# Patient Record
Sex: Male | Born: 1955 | Race: White | Hispanic: Yes | Marital: Married | Smoking: Never smoker
Health system: Southern US, Community
[De-identification: ages and names within clinical notes are randomized; demographics above are authoritative.]

## PROBLEM LIST (undated history)

## (undated) DIAGNOSIS — E669 Obesity, unspecified: Secondary | ICD-10-CM

## (undated) DIAGNOSIS — E785 Hyperlipidemia, unspecified: Secondary | ICD-10-CM

## (undated) DIAGNOSIS — K311 Adult hypertrophic pyloric stenosis: Secondary | ICD-10-CM

## (undated) DIAGNOSIS — M109 Gout, unspecified: Secondary | ICD-10-CM

## (undated) DIAGNOSIS — E119 Type 2 diabetes mellitus without complications: Secondary | ICD-10-CM

## (undated) DIAGNOSIS — I10 Essential (primary) hypertension: Secondary | ICD-10-CM

## (undated) DIAGNOSIS — W3400XA Accidental discharge from unspecified firearms or gun, initial encounter: Secondary | ICD-10-CM

## (undated) DIAGNOSIS — J45909 Unspecified asthma, uncomplicated: Secondary | ICD-10-CM

## (undated) DIAGNOSIS — F1910 Other psychoactive substance abuse, uncomplicated: Secondary | ICD-10-CM

## (undated) DIAGNOSIS — Z905 Acquired absence of kidney: Secondary | ICD-10-CM

## (undated) DIAGNOSIS — I639 Cerebral infarction, unspecified: Secondary | ICD-10-CM

## (undated) DIAGNOSIS — Z8719 Personal history of other diseases of the digestive system: Secondary | ICD-10-CM

## (undated) DIAGNOSIS — Z8709 Personal history of other diseases of the respiratory system: Secondary | ICD-10-CM

## (undated) DIAGNOSIS — G4730 Sleep apnea, unspecified: Secondary | ICD-10-CM

## (undated) DIAGNOSIS — K219 Gastro-esophageal reflux disease without esophagitis: Secondary | ICD-10-CM

## (undated) HISTORY — DX: Hyperlipidemia, unspecified: E78.5

## (undated) HISTORY — DX: Unspecified asthma, uncomplicated: J45.909

## (undated) HISTORY — DX: Acquired absence of kidney: Z90.5

## (undated) HISTORY — DX: Adult hypertrophic pyloric stenosis: K31.1

## (undated) HISTORY — PX: OTHER SURGICAL HISTORY: SHX169

## (undated) HISTORY — DX: Other psychoactive substance abuse, uncomplicated: F19.10

## (undated) HISTORY — PX: COLONOSCOPY: SHX174

## (undated) HISTORY — DX: Personal history of other diseases of the respiratory system: Z87.09

## (undated) HISTORY — DX: Essential (primary) hypertension: I10

## (undated) HISTORY — DX: Gout, unspecified: M10.9

## (undated) HISTORY — DX: Cerebral infarction, unspecified: I63.9

## (undated) HISTORY — DX: Type 2 diabetes mellitus without complications: E11.9

## (undated) HISTORY — DX: Personal history of other diseases of the digestive system: Z87.19

## (undated) HISTORY — DX: Obesity, unspecified: E66.9

## (undated) HISTORY — DX: Gastro-esophageal reflux disease without esophagitis: K21.9

---
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
## 1976-11-08 HISTORY — PX: OTHER SURGICAL HISTORY: SHX169

## 1977-09-25 DIAGNOSIS — W3400XA Accidental discharge from unspecified firearms or gun, initial encounter: Secondary | ICD-10-CM

## 1977-09-25 HISTORY — DX: Accidental discharge from unspecified firearms or gun, initial encounter: W34.00XA

## 1981-11-08 HISTORY — PX: OTHER SURGICAL HISTORY: SHX169

## 2014-01-21 ENCOUNTER — Encounter: Payer: Self-pay | Admitting: Family Medicine

## 2014-01-21 ENCOUNTER — Ambulatory Visit (INDEPENDENT_AMBULATORY_CARE_PROVIDER_SITE_OTHER): Payer: 59 | Admitting: Family Medicine

## 2014-01-21 VITALS — BP 116/67 | HR 75 | Ht 73.0 in | Wt 241.0 lb

## 2014-01-21 DIAGNOSIS — R200 Anesthesia of skin: Secondary | ICD-10-CM | POA: Insufficient documentation

## 2014-01-21 DIAGNOSIS — Z79899 Other long term (current) drug therapy: Secondary | ICD-10-CM

## 2014-01-21 DIAGNOSIS — E785 Hyperlipidemia, unspecified: Secondary | ICD-10-CM | POA: Insufficient documentation

## 2014-01-21 DIAGNOSIS — R209 Unspecified disturbances of skin sensation: Secondary | ICD-10-CM

## 2014-01-21 DIAGNOSIS — Z5181 Encounter for therapeutic drug level monitoring: Secondary | ICD-10-CM

## 2014-01-21 DIAGNOSIS — Z905 Acquired absence of kidney: Secondary | ICD-10-CM

## 2014-01-21 DIAGNOSIS — M109 Gout, unspecified: Secondary | ICD-10-CM | POA: Insufficient documentation

## 2014-01-21 DIAGNOSIS — E119 Type 2 diabetes mellitus without complications: Secondary | ICD-10-CM | POA: Insufficient documentation

## 2014-01-21 DIAGNOSIS — R208 Other disturbances of skin sensation: Secondary | ICD-10-CM | POA: Insufficient documentation

## 2014-01-21 MED ORDER — METFORMIN HCL 1000 MG PO TABS: ORAL_TABLET | ORAL | Status: DC

## 2014-01-21 MED ORDER — INSULIN NPH (HUMAN) (ISOPHANE) 100 UNIT/ML ~~LOC~~ SUSP: SUBCUTANEOUS | Status: DC

## 2014-01-21 MED ORDER — PRAVASTATIN SODIUM 40 MG PO TABS: 40.0000 mg | ORAL_TABLET | Freq: Every day | ORAL | Status: DC

## 2014-01-21 MED ORDER — LISINOPRIL 5 MG PO TABS: 5.0000 mg | ORAL_TABLET | Freq: Every day | ORAL | Status: DC
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
## 2014-01-24 ENCOUNTER — Encounter: Payer: Self-pay | Admitting: Family Medicine

## 2014-01-24 ENCOUNTER — Ambulatory Visit (INDEPENDENT_AMBULATORY_CARE_PROVIDER_SITE_OTHER): Payer: 59 | Admitting: Family Medicine

## 2014-01-24 VITALS — BP 137/77 | HR 94 | Ht 73.0 in | Wt 239.0 lb

## 2014-01-24 DIAGNOSIS — Z8249 Family history of ischemic heart disease and other diseases of the circulatory system: Secondary | ICD-10-CM

## 2014-01-24 DIAGNOSIS — E119 Type 2 diabetes mellitus without complications: Secondary | ICD-10-CM

## 2014-01-24 DIAGNOSIS — Z2839 Other underimmunization status: Secondary | ICD-10-CM

## 2014-01-24 DIAGNOSIS — Z23 Encounter for immunization: Secondary | ICD-10-CM

## 2014-01-24 DIAGNOSIS — Z125 Encounter for screening for malignant neoplasm of prostate: Secondary | ICD-10-CM

## 2014-01-24 DIAGNOSIS — Z0000 Encounter for general adult medical examination without abnormal findings: Secondary | ICD-10-CM

## 2014-01-24 DIAGNOSIS — Z283 Underimmunization status: Secondary | ICD-10-CM
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
## 2014-01-24 NOTE — Patient Instructions (Signed)
Dr. Lajoyce Lauber General Advice Following Your Complete Physical Exam  The Benefits of Regular Exercise: Unless you suffer from an uncontrolled cardiovascular condition, studies strongly suggest that regular exercise and physical activity will add to both the quality and length of your life.  The World Health Organization recommends 150 minutes of moderate intensity aerobic activity every week.  This is best split over 3-4 days a week, and can be as simple as a brisk walk for just over 35 minutes "most days of the week".  This type of exercise has been shown to lower LDL-Cholesterol, lower average blood sugars, lower blood pressure, lower cardiovascular disease risk, improve memory, and increase one's overall sense of wellbeing.  The addition of anaerobic (or "strength training") exercises offers additional benefits including but not limited to increased metabolism, prevention of osteoporosis, and improved overall cholesterol levels.  How Can I Strive For A Low-Fat Diet?: Current guidelines recommend that 25-35 percent of your daily energy (food) intake should come from fats.  One might ask how can this be achieved without having to dissect each meal on a daily basis?  Switch to skim or 1% milk instead of whole milk.  Focus on lean meats such as ground [place], fresh fish, baked chicken, and lean cuts of beef as your source of dietary protein.  Limit saturated fat consumption to less than 10% of your daily caloric intake.  Limit trans fatty acid consumption primarily by limiting synthetic trans fats such as partially hydrogenated oils (Ex: fried fast foods).  Substitute olive or vegetable oil for solid fats where possible.  Moderation of Salt Intake: Provided you don't carry a diagnosis of congestive heart failure nor renal failure, I recommend a daily allowance of no more than 2300 mg of salt (sodium).  Keeping under this daily goal is associated with a decreased risk of cardiovascular events, creeping  above it can lead to elevated blood pressures and increases your risk of cardiovascular events.  Milligrams (mg) of salt is listed on all nutrition labels, and your daily intake can add up faster than you think.  Most canned and frozen dinners can pack in over half your daily salt allowance in one meal.    Lifestyle Health Risks: Certain lifestyle choices carry specific health risks.  As you may already know, tobacco use has been associated with increasing one's risk of cardiovascular disease, pulmonary disease, numerous cancers, among many other issues.  What you may not know is that there are medications and nicotine replacement strategies that can more than double your chances of successfully quitting.  I would be thrilled to help manage your quitting strategy if you currently use tobacco products.  When it comes to alcohol use, I've yet to find an "ideal" daily allowance.  Provided an individual does not have a medical condition that is exacerbated by alcohol consumption, general guidelines determine "safe drinking" as no more than two standard drinks for a man or no more than one standard drink for a male per day.  However, much debate still exists on whether any amount of alcohol consumption is technically "safe".  My general advice, keep alcohol consumption to a minimum for general health promotion.  If you or others believe that alcohol, tobacco, or recreational drug use is interfering with your life, I would be happy to provide confidential counseling regarding treatment options.  General "Over The Counter" Nutrition Advice: Postmenopausal women should aim for a daily calcium intake of 1200 mg, however a significant portion of this might already be  provided by diets including milk, yogurt, cheese, and other dairy products.  Vitamin D has been shown to help preserve bone density, prevent fatigue, and has even been shown to help reduce falls in the elderly.  Ensuring a daily intake of 800 Units of  Vitamin D is a good place to start to enjoy the above benefits, we can easily check your Vitamin D level to see if you'd potentially benefit from supplementation beyond 800 Units a day.  Folic Acid intake should be of particular concern to women of childbearing age.  Daily consumption of 400-800 mcg of Folic Acid is recommended to minimize the chance of spinal cord defects in a fetus should pregnancy occur.    For many adults, accidents still remain one of the most common culprits when it comes to cause of death.  Some of the simplest but most effective preventitive habits you can adopt include regular seatbelt use, proper helmet use, securing firearms, and regularly testing your smoke and carbon monoxide detectors.  Balraj Brayfield B. Aquita Simmering DO [facility] [address], [address] , [place] [address] Phone: [phone]  Testicular Self-Exam A self-examination of your testicles involves looking at and feeling your testicles for abnormal lumps or swelling. Several things can cause swelling, lumps, or pain in your testicles. Some of these causes are:  Injuries.  Inflammation.  Infection.  Accumulation of fluids around your testicle (hydrocele).  Twisted testicles (testicular torsion).  Testicular cancer. Self-examination of the testicles and groin areas may be advised if you are at risk for testicular cancer. Risks for testicular cancer include:  An undescended testicle (cryptorchidism).  A history of previous testicular cancer.  A family history of testicular cancer. The testicles are easiest to examine after warm baths or showers and are more difficult to examine when you are cold. This is because the muscles attached to the testicles retract and pull them up higher or into the abdomen. Follow these steps while you are standing:  Hold your penis away from your body.  Roll one testicle between your thumb and forefinger, feeling the entire testicle.  Roll the other  testicle between your thumb and forefinger, feeling the entire testicle. Feel for lumps, swelling, or discomfort. A normal testicle is egg shaped and feels firm. It is smooth and not tender. The spermatic cord can be felt as a firm spaghetti-like cord at the back of your testicle. It is also important to examine the crease between the front of your leg and your abdomen. Feel for any bumps that are tender. These could be enlarged lymph nodes.  Document Released: 01/31/2001 Document Revised: 06/27/2013 Document Reviewed: 04/16/2013 [employer] Patient Information 2014 [employer].  

## 2014-01-25 ENCOUNTER — Telehealth: Payer: Self-pay | Admitting: Family Medicine

## 2014-01-29 ENCOUNTER — Telehealth: Payer: Self-pay | Admitting: Family Medicine

## 2014-01-29 DIAGNOSIS — E119 Type 2 diabetes mellitus without complications: Secondary | ICD-10-CM

## 2014-01-29 LAB — COMPLETE METABOLIC PANEL WITH GFR
ALT: 20 U/L (ref 0–53)
AST: 15 U/L (ref 0–37)
Albumin: 4.5 g/dL (ref 3.5–5.2)
Alkaline Phosphatase: 80 U/L (ref 39–117)
BUN: 16 mg/dL (ref 6–23)
CALCIUM: 9.9 mg/dL (ref 8.4–10.5)
CHLORIDE: 100 meq/L (ref 96–112)
CO2: 28 meq/L (ref 19–32)
CREATININE: 1.02 mg/dL (ref 0.50–1.35)
GFR, Est Non African American: 80 mL/min
Glucose, Bld: 175 mg/dL — ABNORMAL HIGH (ref 70–99)
POTASSIUM: 4.3 meq/L (ref 3.5–5.3)
Sodium: 141 mEq/L (ref 135–145)
Total Bilirubin: 1.2 mg/dL (ref 0.2–1.2)
Total Protein: 6.8 g/dL (ref 6.0–8.3)

## 2014-01-29 LAB — LIPID PANEL
Cholesterol: 178 mg/dL (ref 0–200)
HDL: 48 mg/dL (ref >39)
LDL CALC: 108 mg/dL — AB (ref 0–99)
Total CHOL/HDL Ratio: 3.7 Ratio
Triglycerides: 108 mg/dL (ref <150)
VLDL: 22 mg/dL (ref 0–40)

## 2014-01-29 LAB — HEMOGLOBIN A1C
HEMOGLOBIN A1C: 9.8 % — AB (ref <5.7)
Mean Plasma Glucose: 235 mg/dL — ABNORMAL HIGH (ref <117)

## 2014-01-29 LAB — PSA: PSA: 0.62 ng/mL (ref <=4.00)

## 2014-01-29 MED ORDER — DAPAGLIFLOZIN PROPANEDIOL 10 MG PO TABS: 10.0000 mg | ORAL_TABLET | ORAL | Status: DC

## 2014-01-29 NOTE — Telephone Encounter (Signed)
Pt notified; coupon card up front

## 2014-01-29 NOTE — Telephone Encounter (Signed)
William Bean, Will you please let William Bean know that his PSA was normal, kidney and liver function normal, and cholesterol was just barely elevated.  A1c was 9.8 with a goal of less than 7.0.  I'd encourage him to continue on meformin and insulin however I'd like him to take advantage of a new free medication called Wilder Glade that I've sent to his pharmacy.  It helps lower blood sugar through the urine, I'd encourage him to swing by sometime to pickup a coupon card in the sample closet which makes this medication free.  Follow up 3 months. No changes other than new diabetic medication.

## 2014-01-30 LAB — MICROALBUMIN / CREATININE URINE RATIO
Creatinine, Urine: 101.8 mg/dL
Microalb Creat Ratio: 10 mg/g (ref 0.0–30.0)
Microalb, Ur: 1.02 mg/dL (ref 0.00–1.89)

## 2014-02-15 ENCOUNTER — Telehealth: Payer: Self-pay | Admitting: *Deleted

## 2014-02-15 DIAGNOSIS — J45909 Unspecified asthma, uncomplicated: Secondary | ICD-10-CM

## 2014-02-15 MED ORDER — ALBUTEROL SULFATE HFA 108 (90 BASE) MCG/ACT IN AERS: INHALATION_SPRAY | RESPIRATORY_TRACT | Status: DC

## 2014-02-15 NOTE — Telephone Encounter (Signed)
No problem, Rx sent to [facility] in [place].

## 2014-02-15 NOTE — Telephone Encounter (Signed)
Pt notified via vm

## 2014-02-15 NOTE — Telephone Encounter (Signed)
Pt left a message that he needs an albuterol inhaler sent to his pharm. He states he briefly mentioned this at his last visit that he used this as a child. This is not on his med list and I dont see that he discussed this with you. Please advise

## 2014-02-19 ENCOUNTER — Encounter: Payer: Self-pay | Admitting: Family Medicine

## 2014-02-19 ENCOUNTER — Ambulatory Visit (INDEPENDENT_AMBULATORY_CARE_PROVIDER_SITE_OTHER): Payer: 59 | Admitting: Family Medicine

## 2014-02-19 VITALS — BP 119/76 | HR 90 | Temp 98.5°F | Wt 239.0 lb

## 2014-02-19 DIAGNOSIS — E119 Type 2 diabetes mellitus without complications: Secondary | ICD-10-CM

## 2014-02-19 DIAGNOSIS — J069 Acute upper respiratory infection, unspecified: Secondary | ICD-10-CM
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
## 2014-02-26 ENCOUNTER — Encounter: Payer: Self-pay | Admitting: *Deleted

## 2014-02-26 ENCOUNTER — Encounter: Payer: Self-pay | Admitting: Cardiology

## 2014-02-27 ENCOUNTER — Ambulatory Visit (INDEPENDENT_AMBULATORY_CARE_PROVIDER_SITE_OTHER): Payer: 59 | Admitting: Cardiology

## 2014-02-27 ENCOUNTER — Encounter: Payer: Self-pay | Admitting: Cardiology

## 2014-02-27 VITALS — BP 142/74 | HR 84 | Ht 73.0 in | Wt 235.0 lb

## 2014-02-27 DIAGNOSIS — R0989 Other specified symptoms and signs involving the circulatory and respiratory systems: Secondary | ICD-10-CM

## 2014-02-27 DIAGNOSIS — E785 Hyperlipidemia, unspecified: Secondary | ICD-10-CM

## 2014-02-27 DIAGNOSIS — R0600 Dyspnea, unspecified: Secondary | ICD-10-CM | POA: Insufficient documentation

## 2014-02-27 DIAGNOSIS — I2510 Atherosclerotic heart disease of native coronary artery without angina pectoris: Secondary | ICD-10-CM

## 2014-02-27 DIAGNOSIS — R0609 Other forms of dyspnea: Secondary | ICD-10-CM

## 2014-02-27 NOTE — Assessment & Plan Note (Signed)
Continue statin. 

## 2014-02-27 NOTE — Patient Instructions (Signed)
Your physician recommends that you schedule a follow-up appointment in: AS NEEDED PENDING TEST RESULTS  Your physician has requested that you have an exercise tolerance test. For further information please visit [email or web address]. Please also follow instruction sheet, as given.    Exercise Stress Electrocardiogram An exercise stress electrocardiogram is a test to check how blood flows to your heart. It is done to find areas of poor blood flow. You will need to walk on a treadmill for this test. The electrocardiogram will record your heartbeat when you are at rest and when you are exercising. BEFORE THE PROCEDURE  Do not have drinks with caffeine or foods with caffeine for 24 hours before the test, or as told by your doctor.  Follow your doctor's instructions about eating and drinking before the test.  Ask your doctor what medicines you should or should not take before the test. Take your medicines with water unless told by your doctor not to.  If you use an inhaler, bring it with you to the test.  Bring a snack to eat after the test.  Do not  smoke for 4 hours before the test.  Wear comfortable shoes and clothing. PROCEDURE  You will have patches put on your chest. Small areas of your chest may need to be shaved. Wires will be connected to the patches.  Your heart rate will be watched while you are resting and while you are exercising.  You will walk on the treadmill. The treadmill will slowly get faster to raise your heart rate.  The test will take about 1 2 hours. AFTER THE PROCEDURE  Your heart rate and blood pressure will be watched after the test.  You may return to your normal diet, activities, and medicines or as told by your doctor. Document Released: 04/12/2008 Document Revised: 08/15/2013 Document Reviewed: 07/02/2013 Exit[employer] Patient Information 2014 [employer].   
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
## 2014-02-27 NOTE — Assessment & Plan Note (Signed)
Mild dyspnea with extreme activities; 7 year history of DM; ETT for risk stratification.

## 2014-03-05 ENCOUNTER — Ambulatory Visit: Payer: 59 | Admitting: Endocrinology

## 2014-03-11 ENCOUNTER — Ambulatory Visit (INDEPENDENT_AMBULATORY_CARE_PROVIDER_SITE_OTHER): Payer: 59 | Admitting: Endocrinology

## 2014-03-11 ENCOUNTER — Encounter: Payer: Self-pay | Admitting: Endocrinology

## 2014-03-11 VITALS — BP 112/60 | HR 90 | Temp 97.7°F | Ht 73.0 in | Wt 239.0 lb

## 2014-03-11 DIAGNOSIS — E119 Type 2 diabetes mellitus without complications: Secondary | ICD-10-CM

## 2014-03-11 MED ORDER — GLUCOSE BLOOD VI STRP: 1.0000 | ORAL_STRIP | Freq: Two times a day (BID) | Status: DC
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
## 2014-03-11 NOTE — Patient Instructions (Addendum)
good diet and exercise habits significanly improve the control of your diabetes.  please let me know if you wish to be referred to a dietician.  high blood sugar is very risky to your health.  you should see an eye doctor every year.  You are at higher than average risk for pneumonia and hepatitis-B.  You should be vaccinated against both.   controlling your blood pressure and cholesterol drastically reduces the damage diabetes does to your body.  this also applies to quitting smoking.  please discuss these with your doctor.  check your blood sugar twice a day.  vary the time of day when you check, between before the 3 meals, and at bedtime.  also check if you have symptoms of your blood sugar being too high or too low.  please keep a record of the readings and bring it to your next appointment here.  You can write it on any piece of paper.  please call us sooner if your blood sugar goes below 70, or if you have a lot of readings over 200.   Here is a new meter.  i have sent a prescription to your pharmacy. Please come back for a follow-up appointment in 3 months.

## 2014-03-21 ENCOUNTER — Telehealth (HOSPITAL_COMMUNITY): Payer: Self-pay

## 2014-03-21 ENCOUNTER — Other Ambulatory Visit: Payer: Self-pay | Admitting: *Deleted

## 2014-03-21 DIAGNOSIS — J45909 Unspecified asthma, uncomplicated: Secondary | ICD-10-CM

## 2014-03-21 MED ORDER — ALBUTEROL SULFATE HFA 108 (90 BASE) MCG/ACT IN AERS: INHALATION_SPRAY | RESPIRATORY_TRACT | Status: DC

## 2014-03-22 ENCOUNTER — Telehealth (HOSPITAL_COMMUNITY): Payer: Self-pay

## 2014-03-27 ENCOUNTER — Other Ambulatory Visit (HOSPITAL_COMMUNITY): Payer: Self-pay | Admitting: Cardiology

## 2014-03-27 ENCOUNTER — Ambulatory Visit (HOSPITAL_COMMUNITY)
Admission: RE | Admit: 2014-03-27 | Discharge: 2014-03-27 | Disposition: A | Discharge status: Home / Self Care | Payer: 59 | Source: Ambulatory Visit | Attending: Cardiology | Admitting: Cardiology

## 2014-03-27 DIAGNOSIS — R0609 Other forms of dyspnea: Secondary | ICD-10-CM | POA: Insufficient documentation

## 2014-03-27 DIAGNOSIS — R0600 Dyspnea, unspecified: Secondary | ICD-10-CM

## 2014-03-27 DIAGNOSIS — R0989 Other specified symptoms and signs involving the circulatory and respiratory systems: Secondary | ICD-10-CM | POA: Insufficient documentation

## 2014-03-27 DIAGNOSIS — I2510 Atherosclerotic heart disease of native coronary artery without angina pectoris: Secondary | ICD-10-CM | POA: Insufficient documentation

## 2014-03-29 ENCOUNTER — Telehealth: Payer: Self-pay | Admitting: *Deleted

## 2014-03-29 DIAGNOSIS — R9430 Abnormal result of cardiovascular function study, unspecified: Secondary | ICD-10-CM

## 2014-03-29 NOTE — Telephone Encounter (Signed)
Message     Schedule stress echocardiogram    Lelon Perla

## 2014-04-04 NOTE — Telephone Encounter (Signed)
Left message for pt to call.

## 2014-04-11 ENCOUNTER — Encounter: Payer: Self-pay | Admitting: *Deleted

## 2014-04-11 NOTE — Telephone Encounter (Signed)
Spoke with William Bean, Aware of dr Jacalyn Lefevre recommendations.  Instructions discussed with the William Bean. Orders placed and sent to [facility] for scheduling.

## 2014-05-03 ENCOUNTER — Other Ambulatory Visit (HOSPITAL_COMMUNITY): Payer: 59

## 2014-05-09 NOTE — Telephone Encounter (Signed)
Encounter complete. 

## 2014-05-16 NOTE — Telephone Encounter (Signed)
Encounter complete. 

## 2014-05-28 ENCOUNTER — Ambulatory Visit (HOSPITAL_COMMUNITY): Payer: 59 | Attending: Cardiology

## 2014-05-28 DIAGNOSIS — R9439 Abnormal result of other cardiovascular function study: Secondary | ICD-10-CM | POA: Insufficient documentation

## 2014-05-28 DIAGNOSIS — I2510 Atherosclerotic heart disease of native coronary artery without angina pectoris: Secondary | ICD-10-CM

## 2014-05-28 DIAGNOSIS — R9430 Abnormal result of cardiovascular function study, unspecified: Secondary | ICD-10-CM

## 2014-05-28 NOTE — Progress Notes (Signed)
Stress echo performed.

## 2014-06-17 ENCOUNTER — Ambulatory Visit: Payer: 59 | Admitting: Endocrinology

## 2014-06-17 DIAGNOSIS — Z0289 Encounter for other administrative examinations: Secondary | ICD-10-CM

## 2014-07-08 ENCOUNTER — Encounter: Payer: Self-pay | Admitting: Family Medicine

## 2014-07-08 ENCOUNTER — Ambulatory Visit (INDEPENDENT_AMBULATORY_CARE_PROVIDER_SITE_OTHER): Payer: 59 | Admitting: Family Medicine

## 2014-07-08 VITALS — BP 132/74 | HR 72 | Wt 235.0 lb

## 2014-07-08 DIAGNOSIS — N189 Chronic kidney disease, unspecified: Secondary | ICD-10-CM

## 2014-07-08 DIAGNOSIS — E1129 Type 2 diabetes mellitus with other diabetic kidney complication: Secondary | ICD-10-CM

## 2014-07-08 DIAGNOSIS — IMO0002 Reserved for concepts with insufficient information to code with codable children: Secondary | ICD-10-CM

## 2014-07-08 DIAGNOSIS — E1122 Type 2 diabetes mellitus with diabetic chronic kidney disease: Secondary | ICD-10-CM

## 2014-07-08 DIAGNOSIS — E785 Hyperlipidemia, unspecified: Secondary | ICD-10-CM

## 2014-07-08 DIAGNOSIS — M792 Neuralgia and neuritis, unspecified: Secondary | ICD-10-CM | POA: Insufficient documentation

## 2014-07-08 LAB — POCT GLYCOSYLATED HEMOGLOBIN (HGB A1C): Hemoglobin A1C: 8.4

## 2014-07-08 MED ORDER — LISINOPRIL 5 MG PO TABS: 5.0000 mg | ORAL_TABLET | Freq: Every day | ORAL | Status: DC

## 2014-07-08 MED ORDER — INSULIN NPH (HUMAN) (ISOPHANE) 100 UNIT/ML ~~LOC~~ SUSP: SUBCUTANEOUS | Status: DC
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
## 2014-07-09 LAB — BASIC METABOLIC PANEL WITH GFR
BUN: 14 mg/dL (ref 6–23)
CHLORIDE: 101 meq/L (ref 96–112)
CO2: 27 mEq/L (ref 19–32)
Calcium: 10 mg/dL (ref 8.4–10.5)
Creat: 1.12 mg/dL (ref 0.50–1.35)
GFR, EST NON AFRICAN AMERICAN: 71 mL/min
GFR, Est African American: 82 mL/min
Glucose, Bld: 195 mg/dL — ABNORMAL HIGH (ref 70–99)
Potassium: 5.1 mEq/L (ref 3.5–5.3)
Sodium: 141 mEq/L (ref 135–145)

## 2014-07-10 LAB — HM DIABETES EYE EXAM

## 2014-08-05 ENCOUNTER — Ambulatory Visit: Payer: 59 | Admitting: Sports Medicine

## 2014-08-12 ENCOUNTER — Encounter: Payer: Self-pay | Admitting: Family Medicine

## 2014-08-12 ENCOUNTER — Ambulatory Visit (INDEPENDENT_AMBULATORY_CARE_PROVIDER_SITE_OTHER): Payer: 59 | Admitting: Family Medicine

## 2014-08-12 VITALS — BP 128/74 | HR 80 | Temp 97.9°F | Ht 73.0 in | Wt 243.0 lb

## 2014-08-12 DIAGNOSIS — N2889 Other specified disorders of kidney and ureter: Secondary | ICD-10-CM | POA: Insufficient documentation

## 2014-08-12 DIAGNOSIS — Z23 Encounter for immunization: Secondary | ICD-10-CM

## 2014-08-12 DIAGNOSIS — E119 Type 2 diabetes mellitus without complications: Secondary | ICD-10-CM

## 2014-08-12 DIAGNOSIS — N181 Chronic kidney disease, stage 1: Secondary | ICD-10-CM | POA: Insufficient documentation

## 2014-08-12 DIAGNOSIS — N189 Chronic kidney disease, unspecified: Secondary | ICD-10-CM

## 2014-08-12 MED ORDER — PRAVASTATIN SODIUM 20 MG PO TABS: 20.0000 mg | ORAL_TABLET | Freq: Every day | ORAL | Status: DC

## 2014-08-12 NOTE — Addendum Note (Signed)
Addended by: Margette Fast on: 08/12/2014 11:26 AM   Modules accepted: Orders
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
## 2014-08-13 LAB — BASIC METABOLIC PANEL WITH GFR
BUN: 12 mg/dL (ref 6–23)
CO2: 27 meq/L (ref 19–32)
CREATININE: 0.91 mg/dL (ref 0.50–1.35)
Calcium: 9.5 mg/dL (ref 8.4–10.5)
Chloride: 103 mEq/L (ref 96–112)
GFR, Est African American: >89 mL/min
GFR, Est Non African American: >89 mL/min
GLUCOSE: 95 mg/dL (ref 70–99)
Potassium: 4.7 mEq/L (ref 3.5–5.3)
Sodium: 139 mEq/L (ref 135–145)

## 2014-08-23 ENCOUNTER — Ambulatory Visit: Payer: 59 | Admitting: Family Medicine

## 2014-08-26 ENCOUNTER — Ambulatory Visit (INDEPENDENT_AMBULATORY_CARE_PROVIDER_SITE_OTHER): Payer: 59

## 2014-08-26 ENCOUNTER — Encounter: Payer: Self-pay | Admitting: Family Medicine

## 2014-08-26 ENCOUNTER — Ambulatory Visit (INDEPENDENT_AMBULATORY_CARE_PROVIDER_SITE_OTHER): Payer: 59 | Admitting: Family Medicine

## 2014-08-26 VITALS — BP 134/72 | HR 81 | Temp 98.3°F | Wt 241.0 lb

## 2014-08-26 DIAGNOSIS — M2580 Other specified joint disorders, unspecified joint: Secondary | ICD-10-CM

## 2014-08-26 DIAGNOSIS — M549 Dorsalgia, unspecified: Secondary | ICD-10-CM

## 2014-08-26 DIAGNOSIS — N189 Chronic kidney disease, unspecified: Secondary | ICD-10-CM

## 2014-08-26 DIAGNOSIS — Z905 Acquired absence of kidney: Secondary | ICD-10-CM

## 2014-08-26 DIAGNOSIS — R109 Unspecified abdominal pain: Secondary | ICD-10-CM

## 2014-08-26 DIAGNOSIS — N181 Chronic kidney disease, stage 1: Secondary | ICD-10-CM

## 2014-08-26 LAB — POCT URINALYSIS DIPSTICK
BILIRUBIN UA: NEGATIVE
Blood, UA: NEGATIVE
COLOR UA: 100
Ketones, UA: NEGATIVE
LEUKOCYTES UA: NEGATIVE
Nitrite, UA: NEGATIVE
Protein, UA: NEGATIVE
Spec Grav, UA: 1.015
Urobilinogen, UA: 0.2
pH, UA: 7
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
## 2014-08-27 ENCOUNTER — Telehealth: Payer: Self-pay | Admitting: Family Medicine

## 2014-08-27 DIAGNOSIS — R109 Unspecified abdominal pain: Secondary | ICD-10-CM

## 2014-08-27 MED ORDER — DICYCLOMINE HCL 20 MG PO TABS: 20.0000 mg | ORAL_TABLET | Freq: Four times a day (QID) | ORAL | Status: DC | PRN

## 2014-08-27 NOTE — Telephone Encounter (Signed)
William Bean, Will you please let patient know that his xray did not show any signs of stones or calcification within the urinary system which is reassuring.  If his flank pain returns I'd recommend taking dicyclomine which treats bowel spasms.  I've sent this Rx to his [facility] to be used only on an as needed basis.

## 2014-08-27 NOTE — Telephone Encounter (Signed)
Pt.notified

## 2014-10-07 ENCOUNTER — Ambulatory Visit: Payer: 59 | Admitting: Family Medicine

## 2014-10-14 ENCOUNTER — Ambulatory Visit: Payer: 59 | Admitting: Family Medicine

## 2014-10-14 DIAGNOSIS — Z0289 Encounter for other administrative examinations: Secondary | ICD-10-CM

## 2014-12-11 ENCOUNTER — Encounter: Payer: Self-pay | Admitting: Family Medicine

## 2014-12-11 ENCOUNTER — Ambulatory Visit (INDEPENDENT_AMBULATORY_CARE_PROVIDER_SITE_OTHER): Payer: Self-pay | Admitting: Family Medicine

## 2014-12-11 VITALS — BP 139/79 | HR 82 | Wt 245.0 lb

## 2014-12-11 DIAGNOSIS — E785 Hyperlipidemia, unspecified: Secondary | ICD-10-CM

## 2014-12-11 DIAGNOSIS — R208 Other disturbances of skin sensation: Secondary | ICD-10-CM

## 2014-12-11 DIAGNOSIS — L989 Disorder of the skin and subcutaneous tissue, unspecified: Secondary | ICD-10-CM

## 2014-12-11 DIAGNOSIS — M6250 Muscle wasting and atrophy, not elsewhere classified, unspecified site: Secondary | ICD-10-CM

## 2014-12-11 DIAGNOSIS — E119 Type 2 diabetes mellitus without complications: Secondary | ICD-10-CM

## 2014-12-11 DIAGNOSIS — R200 Anesthesia of skin: Secondary | ICD-10-CM
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
## 2014-12-23 DIAGNOSIS — C439 Malignant melanoma of skin, unspecified: Secondary | ICD-10-CM

## 2014-12-23 HISTORY — DX: Malignant melanoma of skin, unspecified: C43.9

## 2015-01-07 ENCOUNTER — Ambulatory Visit: Payer: Self-pay | Admitting: Neurology

## 2015-01-08 ENCOUNTER — Other Ambulatory Visit: Payer: Self-pay

## 2015-01-08 DIAGNOSIS — R109 Unspecified abdominal pain: Secondary | ICD-10-CM

## 2015-01-08 MED ORDER — DICYCLOMINE HCL 20 MG PO TABS: 20.0000 mg | ORAL_TABLET | Freq: Four times a day (QID) | ORAL | Status: DC | PRN

## 2015-01-21 ENCOUNTER — Encounter: Payer: Self-pay | Admitting: Endocrinology

## 2015-01-21 ENCOUNTER — Encounter: Payer: Self-pay | Admitting: Family Medicine

## 2015-01-21 DIAGNOSIS — D229 Melanocytic nevi, unspecified: Secondary | ICD-10-CM | POA: Insufficient documentation

## 2015-01-29 ENCOUNTER — Ambulatory Visit: Payer: 59 | Admitting: Endocrinology

## 2015-02-04 ENCOUNTER — Ambulatory Visit (INDEPENDENT_AMBULATORY_CARE_PROVIDER_SITE_OTHER): Payer: 59 | Admitting: Neurology

## 2015-02-04 ENCOUNTER — Encounter: Payer: Self-pay | Admitting: Neurology

## 2015-02-04 VITALS — BP 118/70 | HR 93 | Ht 73.0 in | Wt 245.1 lb

## 2015-02-04 DIAGNOSIS — E119 Type 2 diabetes mellitus without complications: Secondary | ICD-10-CM

## 2015-02-04 DIAGNOSIS — E1340 Other specified diabetes mellitus with diabetic neuropathy, unspecified: Secondary | ICD-10-CM

## 2015-02-04 DIAGNOSIS — G621 Alcoholic polyneuropathy: Secondary | ICD-10-CM

## 2015-02-04 DIAGNOSIS — R202 Paresthesia of skin: Secondary | ICD-10-CM

## 2015-02-04 DIAGNOSIS — E0842 Diabetes mellitus due to underlying condition with diabetic polyneuropathy: Secondary | ICD-10-CM

## 2015-02-04 LAB — VITAMIN B12: Vitamin B-12: 812 pg/mL (ref 211–911)

## 2015-02-04 LAB — TSH: TSH: 2.928 u[IU]/mL (ref 0.350–4.500)

## 2015-02-04 NOTE — Patient Instructions (Signed)
1.  Check blood work today 2.  If your feet numbness worsens, please contact my office so we can schedule an EMG 3.  Continue tight control of your sugars
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
## 2015-02-06 LAB — SPEP & IFE WITH QIG
ALBUMIN ELP: 4.7 g/dL (ref 3.8–4.8)
Alpha-1-Globulin: 0.2 g/dL (ref 0.2–0.3)
Alpha-2-Globulin: 0.7 g/dL (ref 0.5–0.9)
Beta 2: 0.4 g/dL (ref 0.2–0.5)
Beta Globulin: 0.5 g/dL (ref 0.4–0.6)
Gamma Globulin: 0.9 g/dL (ref 0.8–1.7)
IGM, SERUM: 110 mg/dL (ref 41–251)
IgA: 324 mg/dL (ref 68–379)
IgG (Immunoglobin G), Serum: 881 mg/dL (ref 650–1600)
TOTAL PROTEIN, SERUM ELECTROPHOR: 7.3 g/dL (ref 6.1–8.1)

## 2015-02-06 LAB — UIFE/LIGHT CHAINS/TP QN, 24-HR UR
ALBUMIN, U: DETECTED
ALPHA 1 UR: DETECTED — AB
ALPHA 2 UR: DETECTED — AB
Beta, Urine: DETECTED — AB
GAMMA UR: DETECTED — AB
TOTAL PROTEIN, URINE-UPE24: 4 mg/dL — AB (ref 5–25)

## 2015-02-06 LAB — CERULOPLASMIN: Ceruloplasmin: 24 mg/dL (ref 18–36)

## 2015-02-06 LAB — COPPER, SERUM: Copper: 83 ug/dL (ref 70–175)

## 2015-02-07 LAB — VITAMIN B1: VITAMIN B1 (THIAMINE): 26 nmol/L (ref 8–30)

## 2015-02-12 ENCOUNTER — Ambulatory Visit (INDEPENDENT_AMBULATORY_CARE_PROVIDER_SITE_OTHER): Payer: 59 | Admitting: Endocrinology

## 2015-02-12 ENCOUNTER — Encounter: Payer: Self-pay | Admitting: Endocrinology

## 2015-02-12 VITALS — BP 132/84 | HR 98 | Temp 97.4°F | Ht 73.0 in | Wt 244.0 lb

## 2015-02-12 DIAGNOSIS — E1142 Type 2 diabetes mellitus with diabetic polyneuropathy: Secondary | ICD-10-CM

## 2015-02-12 DIAGNOSIS — N529 Male erectile dysfunction, unspecified: Secondary | ICD-10-CM | POA: Diagnosis not present

## 2015-02-12 MED ORDER — INSULIN LISPRO 100 UNIT/ML (KWIKPEN)
PEN_INJECTOR | SUBCUTANEOUS | Status: DC
Start: 2015-02-12 — End: 2016-04-01

## 2015-02-12 NOTE — Patient Instructions (Addendum)
check your blood sugar twice a day.  vary the time of day when you check, between before the 3 meals, and at bedtime.  also check if you have symptoms of your blood sugar being too high or too low.  please keep a record of the readings and bring it to your next appointment here.  You can write it on any piece of paper.  please call us sooner if your blood sugar goes below 70, or if you have a lot of readings over 200.  Please change your current insulin to humalog.  3 times a day (just before each meal) 20-10-20 units.   Please see Vaughan Basta today, to learn about the pen.   Please come back for a follow-up appointment in 2-4 weeks.
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
## 2015-02-13 ENCOUNTER — Telehealth: Payer: Self-pay | Admitting: Family Medicine

## 2015-02-13 DIAGNOSIS — E119 Type 2 diabetes mellitus without complications: Secondary | ICD-10-CM

## 2015-02-13 DIAGNOSIS — E785 Hyperlipidemia, unspecified: Secondary | ICD-10-CM

## 2015-02-13 LAB — LIPID PANEL
CHOL/HDL RATIO: 4 ratio
CHOLESTEROL: 173 mg/dL (ref 0–200)
HDL: 43 mg/dL (ref >=40)
LDL Cholesterol: 104 mg/dL — ABNORMAL HIGH (ref 0–99)
Triglycerides: 131 mg/dL (ref <150)
VLDL: 26 mg/dL (ref 0–40)

## 2015-02-13 LAB — HEMOGLOBIN A1C
HEMOGLOBIN A1C: 9.2 % — AB (ref <5.7)
MEAN PLASMA GLUCOSE: 217 mg/dL — AB (ref <117)

## 2015-02-13 MED ORDER — PRAVASTATIN SODIUM 20 MG PO TABS: 30.0000 mg | ORAL_TABLET | Freq: Every day | ORAL | Status: DC

## 2015-02-13 NOTE — Telephone Encounter (Signed)
William Bean, Will you please let patient know that his LDL cholesterol is almost at goal but not quite there. I'd recommend he increase his pravastatin dose to 1.5 tablets daily and return to recheck this in three months. New rx sent to [facility].  It looks like Dr. Loanne Drilling made some adjustments to his insulin yesterday so I don't have any new recommendations with respect to diabetes.

## 2015-02-13 NOTE — Telephone Encounter (Signed)
Pt.notified

## 2015-03-12 ENCOUNTER — Ambulatory Visit: Payer: 59 | Admitting: Endocrinology

## 2015-03-12 ENCOUNTER — Encounter: Payer: 59 | Admitting: Nutrition

## 2015-03-17 ENCOUNTER — Ambulatory Visit: Payer: 59 | Admitting: Endocrinology

## 2015-03-17 DIAGNOSIS — Z0289 Encounter for other administrative examinations: Secondary | ICD-10-CM

## 2015-03-31 ENCOUNTER — Telehealth: Payer: Self-pay | Admitting: Family Medicine

## 2015-03-31 ENCOUNTER — Other Ambulatory Visit: Payer: Self-pay | Admitting: Family Medicine

## 2015-03-31 DIAGNOSIS — E119 Type 2 diabetes mellitus without complications: Secondary | ICD-10-CM

## 2015-03-31 MED ORDER — PRAVASTATIN SODIUM 20 MG PO TABS: 30.0000 mg | ORAL_TABLET | Freq: Every day | ORAL | Status: DC

## 2015-03-31 NOTE — Telephone Encounter (Signed)
Refill req 

## 2015-04-14 ENCOUNTER — Other Ambulatory Visit: Payer: Self-pay | Admitting: *Deleted

## 2015-04-14 ENCOUNTER — Other Ambulatory Visit: Payer: Self-pay | Admitting: Family Medicine

## 2015-04-14 DIAGNOSIS — E1122 Type 2 diabetes mellitus with diabetic chronic kidney disease: Secondary | ICD-10-CM

## 2015-04-14 MED ORDER — LISINOPRIL 5 MG PO TABS: 5.0000 mg | ORAL_TABLET | Freq: Every day | ORAL | Status: DC

## 2015-09-24 ENCOUNTER — Ambulatory Visit (INDEPENDENT_AMBULATORY_CARE_PROVIDER_SITE_OTHER): Payer: 59 | Admitting: Family Medicine

## 2015-09-24 ENCOUNTER — Encounter: Payer: Self-pay | Admitting: Family Medicine

## 2015-09-24 ENCOUNTER — Telehealth: Payer: Self-pay | Admitting: Family Medicine

## 2015-09-24 VITALS — BP 132/78 | HR 90 | Wt 232.0 lb

## 2015-09-24 DIAGNOSIS — Z794 Long term (current) use of insulin: Secondary | ICD-10-CM

## 2015-09-24 DIAGNOSIS — Z23 Encounter for immunization: Secondary | ICD-10-CM | POA: Diagnosis not present

## 2015-09-24 DIAGNOSIS — E1142 Type 2 diabetes mellitus with diabetic polyneuropathy: Secondary | ICD-10-CM | POA: Diagnosis not present

## 2015-09-24 DIAGNOSIS — M25561 Pain in right knee: Secondary | ICD-10-CM | POA: Insufficient documentation

## 2015-09-24 LAB — POCT GLYCOSYLATED HEMOGLOBIN (HGB A1C): HEMOGLOBIN A1C: 7.7

## 2015-09-24 LAB — POCT URINALYSIS DIPSTICK
BILIRUBIN UA: NEGATIVE
Glucose, UA: NEGATIVE
Ketones, UA: NEGATIVE
LEUKOCYTES UA: NEGATIVE
NITRITE UA: NEGATIVE
PH UA: 6
PROTEIN UA: NEGATIVE
RBC UA: NEGATIVE
Spec Grav, UA: 1.02
UROBILINOGEN UA: 0.2

## 2015-09-24 MED ORDER — AMBULATORY NON FORMULARY MEDICATION: Status: DC

## 2015-09-24 MED ORDER — METFORMIN HCL 1000 MG PO TABS: ORAL_TABLET | ORAL | Status: DC

## 2015-09-24 MED ORDER — GLUCOSE BLOOD VI STRP: 1.0000 | ORAL_STRIP | Freq: Two times a day (BID) | Status: DC
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
## 2015-09-24 NOTE — Telephone Encounter (Signed)
We please let patient know that his urine test was normal showing no signs of kidney damage or lingering signs of ketones in his urine. Whatever caused his episode on Saturday did not do any permanent damage. This is reassuring

## 2015-09-25 ENCOUNTER — Telehealth: Payer: Self-pay | Admitting: Family Medicine

## 2015-09-25 NOTE — Telephone Encounter (Signed)
Will you please update patient and then try a referral to Dr. Posey Pronto please?

## 2015-09-25 NOTE — Telephone Encounter (Signed)
Awaiting call back.

## 2015-09-25 NOTE — Telephone Encounter (Addendum)
Dr. Ileene Rubens,    I sent William Bean referral to [facility] in [place] which is the [facility] office and they faxed back stating he has been dismissed from their practice since 06/04/13. This is the only Endocrinologist in [place] I can try Dr. Posey Pronto in [facility] or Dr. Charlestine Night. Please advise. - CF

## 2015-10-10 NOTE — Telephone Encounter (Signed)
Results mailed 

## 2015-12-08 ENCOUNTER — Telehealth: Payer: Self-pay

## 2015-12-08 DIAGNOSIS — M25561 Pain in right knee: Secondary | ICD-10-CM

## 2015-12-08 NOTE — Telephone Encounter (Signed)
Pt reports that his knee pain has become unbearable and that he would like a referral to an orthopaedics.

## 2015-12-09 NOTE — Telephone Encounter (Signed)
Referral has been placed. You have my approval to place similar referrals in the future.

## 2016-01-18 DIAGNOSIS — I10 Essential (primary) hypertension: Secondary | ICD-10-CM | POA: Insufficient documentation

## 2016-01-18 DIAGNOSIS — E109 Type 1 diabetes mellitus without complications: Secondary | ICD-10-CM | POA: Insufficient documentation

## 2016-01-20 DIAGNOSIS — N5201 Erectile dysfunction due to arterial insufficiency: Secondary | ICD-10-CM | POA: Insufficient documentation

## 2016-02-06 ENCOUNTER — Telehealth: Payer: Self-pay

## 2016-02-06 DIAGNOSIS — J45909 Unspecified asthma, uncomplicated: Secondary | ICD-10-CM

## 2016-02-06 MED ORDER — ALBUTEROL SULFATE HFA 108 (90 BASE) MCG/ACT IN AERS: INHALATION_SPRAY | RESPIRATORY_TRACT | Status: DC

## 2016-02-06 NOTE — Telephone Encounter (Signed)
Refill sent to [facility]  

## 2016-03-31 ENCOUNTER — Telehealth: Payer: Self-pay | Admitting: *Deleted

## 2016-03-31 NOTE — Telephone Encounter (Signed)
Pt left a vm stating he was having some chest concerns. He wanted a referral placed to a cardiologist. Called the patient back and he states he has had a sharp pain in the very center of his chest. He states this was a random occurrence and has not since occurred. The patient also states he had an episode in which he did have some SOB. The patient denies having any chest pain at the present. Advised the patient that we will put him on the schedule in the am so be evaluated and do an EKG. Advised that until then if any chest pain, radiating pain to the left side. SOB, nausea, dizziness ect. To immediately go to the ER. Pt voiced understanding.

## 2016-03-31 NOTE — Telephone Encounter (Signed)
agreed

## 2016-04-01 ENCOUNTER — Ambulatory Visit (INDEPENDENT_AMBULATORY_CARE_PROVIDER_SITE_OTHER): Payer: BLUE CROSS/BLUE SHIELD | Admitting: Family Medicine

## 2016-04-01 ENCOUNTER — Encounter: Payer: Self-pay | Admitting: Family Medicine

## 2016-04-01 VITALS — BP 149/76 | HR 81 | Wt 246.0 lb

## 2016-04-01 DIAGNOSIS — M25561 Pain in right knee: Secondary | ICD-10-CM

## 2016-04-01 DIAGNOSIS — M25559 Pain in unspecified hip: Secondary | ICD-10-CM | POA: Diagnosis not present

## 2016-04-01 DIAGNOSIS — M25551 Pain in right hip: Secondary | ICD-10-CM

## 2016-04-01 DIAGNOSIS — M25569 Pain in unspecified knee: Secondary | ICD-10-CM | POA: Diagnosis not present

## 2016-04-01 DIAGNOSIS — R0789 Other chest pain: Secondary | ICD-10-CM | POA: Diagnosis not present

## 2016-04-01 DIAGNOSIS — M25552 Pain in left hip: Secondary | ICD-10-CM | POA: Diagnosis not present

## 2016-04-01 LAB — RHEUMATOID FACTOR

## 2016-04-01 MED ORDER — KETOCONAZOLE 2 % EX SHAM: MEDICATED_SHAMPOO | CUTANEOUS | Status: DC
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
## 2016-04-02 LAB — CYCLIC CITRUL PEPTIDE ANTIBODY, IGG: Cyclic Citrullin Peptide Ab: <16 Units

## 2016-04-02 NOTE — Addendum Note (Signed)
Addended by: Huel Cote on: 04/02/2016 11:32 AM   Modules accepted: Orders

## 2016-04-06 ENCOUNTER — Other Ambulatory Visit: Payer: Self-pay | Admitting: Family Medicine

## 2016-08-31 ENCOUNTER — Ambulatory Visit: Payer: BLUE CROSS/BLUE SHIELD | Admitting: Osteopathic Medicine

## 2016-09-15 ENCOUNTER — Encounter: Payer: Self-pay | Admitting: Osteopathic Medicine

## 2016-09-15 ENCOUNTER — Ambulatory Visit (INDEPENDENT_AMBULATORY_CARE_PROVIDER_SITE_OTHER): Payer: BLUE CROSS/BLUE SHIELD | Admitting: Osteopathic Medicine

## 2016-09-15 VITALS — BP 129/69 | HR 80 | Ht 73.0 in | Wt 245.0 lb

## 2016-09-15 DIAGNOSIS — L942 Calcinosis cutis: Secondary | ICD-10-CM

## 2016-09-15 DIAGNOSIS — M6780 Other specified disorders of synovium and tendon, unspecified site: Secondary | ICD-10-CM

## 2016-09-15 DIAGNOSIS — L609 Nail disorder, unspecified: Secondary | ICD-10-CM

## 2016-09-15 DIAGNOSIS — Z1159 Encounter for screening for other viral diseases: Secondary | ICD-10-CM

## 2016-09-15 DIAGNOSIS — E1142 Type 2 diabetes mellitus with diabetic polyneuropathy: Secondary | ICD-10-CM

## 2016-09-15 DIAGNOSIS — K599 Functional intestinal disorder, unspecified: Secondary | ICD-10-CM

## 2016-09-15 DIAGNOSIS — Z794 Long term (current) use of insulin: Secondary | ICD-10-CM | POA: Diagnosis not present

## 2016-09-15 DIAGNOSIS — E785 Hyperlipidemia, unspecified: Secondary | ICD-10-CM | POA: Diagnosis not present

## 2016-09-15 DIAGNOSIS — Z23 Encounter for immunization: Secondary | ICD-10-CM | POA: Diagnosis not present

## 2016-09-15 DIAGNOSIS — Z711 Person with feared health complaint in whom no diagnosis is made: Secondary | ICD-10-CM

## 2016-09-15 DIAGNOSIS — Z905 Acquired absence of kidney: Secondary | ICD-10-CM

## 2016-09-15 LAB — CBC WITH DIFFERENTIAL/PLATELET
BASOS PCT: 0 %
Basophils Absolute: 0 cells/uL (ref 0–200)
EOS ABS: 95 {cells}/uL (ref 15–500)
Eosinophils Relative: 1 %
HEMATOCRIT: 44.6 % (ref 38.5–50.0)
Hemoglobin: 15.2 g/dL (ref 13.2–17.1)
LYMPHS PCT: 30 %
Lymphs Abs: 2850 cells/uL (ref 850–3900)
MCH: 29.4 pg (ref 27.0–33.0)
MCHC: 34.1 g/dL (ref 32.0–36.0)
MCV: 86.3 fL (ref 80.0–100.0)
MONO ABS: 475 {cells}/uL (ref 200–950)
MPV: 10.1 fL (ref 7.5–12.5)
Monocytes Relative: 5 %
Neutro Abs: 6080 cells/uL (ref 1500–7800)
Neutrophils Relative %: 64 %
Platelets: 174 10*3/uL (ref 140–400)
RBC: 5.17 MIL/uL (ref 4.20–5.80)
RDW: 14.4 % (ref 11.0–15.0)
WBC: 9.5 10*3/uL (ref 3.8–10.8)

## 2016-09-15 MED ORDER — LISINOPRIL 5 MG PO TABS
5.0000 mg | ORAL_TABLET | Freq: Every day | ORAL | 3 refills | Status: DC
Start: 2016-09-15 — End: 2017-09-05

## 2016-09-15 MED ORDER — DICYCLOMINE HCL 20 MG PO TABS: 20.0000 mg | ORAL_TABLET | Freq: Four times a day (QID) | ORAL | 0 refills | Status: DC | PRN

## 2016-09-15 MED ORDER — KETOCONAZOLE 2 % EX SHAM: MEDICATED_SHAMPOO | CUTANEOUS | 11 refills | Status: DC

## 2016-09-15 MED ORDER — METFORMIN HCL 1000 MG PO TABS: ORAL_TABLET | ORAL | 3 refills | Status: DC

## 2016-09-15 MED ORDER — ZOSTER VACCINE LIVE 19400 UNT/0.65ML ~~LOC~~ SUSR: 0.6500 mL | Freq: Once | SUBCUTANEOUS | 0 refills | Status: AC

## 2016-09-15 MED ORDER — LISINOPRIL 5 MG PO TABS: 5.0000 mg | ORAL_TABLET | Freq: Every day | ORAL | 3 refills | Status: DC
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
## 2016-09-16 ENCOUNTER — Other Ambulatory Visit: Payer: Self-pay | Admitting: Osteopathic Medicine

## 2016-09-16 DIAGNOSIS — Z23 Encounter for immunization: Secondary | ICD-10-CM | POA: Diagnosis not present

## 2016-09-16 DIAGNOSIS — E1142 Type 2 diabetes mellitus with diabetic polyneuropathy: Secondary | ICD-10-CM | POA: Diagnosis not present

## 2016-09-16 LAB — LIPID PANEL
CHOLESTEROL: 160 mg/dL (ref <200)
HDL: 45 mg/dL (ref >40)
LDL Cholesterol: 83 mg/dL
TRIGLYCERIDES: 158 mg/dL — AB (ref <150)
Total CHOL/HDL Ratio: 3.6 Ratio (ref <5.0)
VLDL: 32 mg/dL — ABNORMAL HIGH (ref <30)

## 2016-09-16 LAB — COMPLETE METABOLIC PANEL WITH GFR
ALBUMIN: 4.7 g/dL (ref 3.6–5.1)
ALT: 20 U/L (ref 9–46)
AST: 19 U/L (ref 10–35)
Alkaline Phosphatase: 70 U/L (ref 40–115)
BILIRUBIN TOTAL: 0.7 mg/dL (ref 0.2–1.2)
BUN: 18 mg/dL (ref 7–25)
CALCIUM: 10.3 mg/dL (ref 8.6–10.3)
CO2: 30 mmol/L (ref 20–31)
CREATININE: 1.26 mg/dL — AB (ref 0.70–1.25)
Chloride: 102 mmol/L (ref 98–110)
GFR, EST AFRICAN AMERICAN: 70 mL/min (ref >=60)
GFR, Est Non African American: 61 mL/min (ref >=60)
Glucose, Bld: 122 mg/dL — ABNORMAL HIGH (ref 65–99)
Potassium: 5 mmol/L (ref 3.5–5.3)
Sodium: 139 mmol/L (ref 135–146)
TOTAL PROTEIN: 7.2 g/dL (ref 6.1–8.1)

## 2016-09-16 LAB — HEMOGLOBIN A1C
Hgb A1c MFr Bld: 6.9 % — ABNORMAL HIGH (ref <5.7)
Mean Plasma Glucose: 151 mg/dL

## 2016-09-16 LAB — HEPATITIS C ANTIBODY: HCV Ab: NEGATIVE

## 2016-09-16 MED ORDER — PRAVASTATIN SODIUM 20 MG PO TABS: 30.0000 mg | ORAL_TABLET | Freq: Every day | ORAL | 3 refills | Status: DC

## 2016-10-26 DIAGNOSIS — E782 Mixed hyperlipidemia: Secondary | ICD-10-CM | POA: Diagnosis not present

## 2016-10-26 DIAGNOSIS — E109 Type 1 diabetes mellitus without complications: Secondary | ICD-10-CM | POA: Diagnosis not present

## 2016-10-26 DIAGNOSIS — I10 Essential (primary) hypertension: Secondary | ICD-10-CM | POA: Diagnosis not present

## 2016-12-10 ENCOUNTER — Encounter: Payer: Self-pay | Admitting: Physician Assistant

## 2016-12-10 ENCOUNTER — Ambulatory Visit (INDEPENDENT_AMBULATORY_CARE_PROVIDER_SITE_OTHER): Payer: BLUE CROSS/BLUE SHIELD | Admitting: Physician Assistant

## 2016-12-10 VITALS — BP 138/77 | HR 98 | Ht 73.0 in | Wt 253.0 lb

## 2016-12-10 DIAGNOSIS — R9431 Abnormal electrocardiogram [ECG] [EKG]: Secondary | ICD-10-CM

## 2016-12-10 DIAGNOSIS — M26622 Arthralgia of left temporomandibular joint: Secondary | ICD-10-CM

## 2016-12-10 DIAGNOSIS — R0789 Other chest pain: Secondary | ICD-10-CM

## 2016-12-10 DIAGNOSIS — S29011A Strain of muscle and tendon of front wall of thorax, initial encounter: Secondary | ICD-10-CM

## 2016-12-10 NOTE — Patient Instructions (Signed)
Will get echo 

## 2016-12-10 NOTE — Progress Notes (Signed)
   Subjective:    Patient ID: William Bean, male    DOB: [birth year], 61 y.o.   MRN: [id number]  HPI  Pt is a 61 yo DM male who presents to the clinic with wife with left sided jaw pain and chest tightness on the right side. His pain first started with left jaw pain which correlates to TMJ. No radiation. No left arm or neck pain. Started all of a sudden about 1 week ago. Does not correlate to exertion or stress. Worse when eating. Tylenol does help. No known trauma or pop.  Last weekend he started having some right sided chest "achiness and tightness" that is worse with extension of right arm and lifting. He admits to helping his daughter move recently lifting heavy boxes. Denies any sweating. Right sided tightness and discomfort is improving daily.     Review of Systems See HPI.     Objective:   Physical Exam  Constitutional: He appears well-developed and well-nourished.  HENT:  Head: Normocephalic and atraumatic.  Right Ear: External ear normal.  Left Ear: External ear normal.  Tenderness over left TMJ. While doing jaw ROM pt had significant pain with full extension and side to side movements.   tM's clear.   Eyes: Conjunctivae are normal.  Neck: Normal range of motion. Neck supple. No JVD present.  Cardiovascular: Normal rate, regular rhythm and normal heart sounds.   No bruits heard.   Pulmonary/Chest: Effort normal and breath sounds normal. He has no wheezes.  Psychiatric: He has a normal mood and affect. His behavior is normal.          Assessment & Plan:  Marland KitchenMarland KitchenTheotis was seen today for jaw pain.  Diagnoses and all orders for this visit:  Atypical chest pain -     EKG 12-Lead  Abnormal EKG -     ECHOCARDIOGRAM COMPLETE; Future  Arthralgia of left temporomandibular joint  Muscle strain of chest wall, initial encounter   To fully evaluate due to being a DM with chest pain got EKG.  EKG NSR with sinus arrhythmia with possible left atrial enlargement. Since changes  compared to last EKG in 2017 will get stress ECHO.  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 Avoid NSAIDs due to hx of CKD. Continue using tylenol, ice, and started streches. HO given. Avoid chewy food for a few days.  Right chest wall pain is improving. REST with no heavy lifting. I suspect muscle strain from moving boxes. Discussed symptomatic treatment for muscle strain.   Reassurance given to patient today but with DM hx need to fully evaluate cardiac etiology.

## 2016-12-13 DIAGNOSIS — R9431 Abnormal electrocardiogram [ECG] [EKG]: Secondary | ICD-10-CM | POA: Insufficient documentation

## 2016-12-13 DIAGNOSIS — M26622 Arthralgia of left temporomandibular joint: Secondary | ICD-10-CM | POA: Insufficient documentation

## 2017-03-21 ENCOUNTER — Encounter: Payer: Self-pay | Admitting: Osteopathic Medicine

## 2017-03-30 ENCOUNTER — Ambulatory Visit (INDEPENDENT_AMBULATORY_CARE_PROVIDER_SITE_OTHER): Payer: Self-pay | Admitting: Osteopathic Medicine

## 2017-03-30 ENCOUNTER — Encounter: Payer: Self-pay | Admitting: Osteopathic Medicine

## 2017-03-30 ENCOUNTER — Ambulatory Visit: Payer: Self-pay | Admitting: Osteopathic Medicine

## 2017-03-30 VITALS — BP 131/79 | HR 99 | Ht 72.0 in | Wt 246.0 lb

## 2017-03-30 DIAGNOSIS — G8929 Other chronic pain: Secondary | ICD-10-CM

## 2017-03-30 DIAGNOSIS — Z0000 Encounter for general adult medical examination without abnormal findings: Secondary | ICD-10-CM

## 2017-03-30 DIAGNOSIS — M546 Pain in thoracic spine: Secondary | ICD-10-CM

## 2017-03-30 DIAGNOSIS — M67449 Ganglion, unspecified hand: Secondary | ICD-10-CM | POA: Insufficient documentation

## 2017-03-30 DIAGNOSIS — E119 Type 2 diabetes mellitus without complications: Secondary | ICD-10-CM

## 2017-03-30 LAB — POCT GLYCOSYLATED HEMOGLOBIN (HGB A1C): Hemoglobin A1C: 9.5

## 2017-03-30 NOTE — Assessment & Plan Note (Signed)
Injection as above, return in one month.

## 2017-03-30 NOTE — Progress Notes (Signed)
   Subjective:    I'm seeing this patient as a consultation for:  Dr. Emeterio Reeve  CC: Right thumb cyst  HPI: For years this pleasant 61 year old male has had a mass just distal to the right thumb phalangeal joint, it has made and indentation in his nail plate. Minimal pain, moderate, persistent. No trauma.  Past medical history:  Negative.  See flowsheet/record as well for more information.  Surgical history: Negative.  See flowsheet/record as well for more information.  Family history: Negative.  See flowsheet/record as well for more information.  Social history: Negative.  See flowsheet/record as well for more information.  Allergies, and medications have been entered into the medical record, reviewed, and no changes needed.   Review of Systems: No headache, visual changes, nausea, vomiting, diarrhea, constipation, dizziness, abdominal pain, skin rash, fevers, chills, night sweats, weight loss, swollen lymph nodes, body aches, joint swelling, muscle aches, chest pain, shortness of breath, mood changes, visual or auditory hallucinations.   Objective:   General: Well Developed, well nourished, and in no acute distress.  Neuro/Psych: Alert and oriented x3, extra-ocular muscles intact, able to move all 4 extremities, sensation grossly intact. Skin: Warm and dry, no rashes noted.  Respiratory: Not using accessory muscles, speaking in full sentences, trachea midline.  Cardiovascular: Pulses palpable, no extremity edema. Abdomen: Does not appear distended. Right hand: Visible mucinous cyst that looks in close approximation with the interphalangeal joint with expected nail plate grooving.  Procedure: Real-time Ultrasound Guided Injection of right thumb mucinous cyst Device: GE Logiq E  Verbal informed consent obtained.  Time-out conducted.  Noted no overlying erythema, induration, or other signs of local infection.  Skin prepped in a sterile fashion.  Local anesthesia: Topical Ethyl  chloride.  With sterile technique and under real time ultrasound guidance:  Advance to 25-gauge needle into the cyst, I injected 1/2 mL Kenalog 40, 1/2 mL lidocaine, some of the medication was placed directly into the joint although the mucinous cyst did communicate with the joint itself. Completed without difficulty  Pain immediately resolved suggesting accurate placement of the medication.  Advised to call if fevers/chills, erythema, induration, drainage, or persistent bleeding.  Images permanently stored and available for review in the ultrasound unit.  Impression: Technically successful ultrasound guided injection.  Impression and Recommendations:   This case required medical decision making of moderate complexity.  Digital mucinous cyst Injection as above, return in one month.

## 2017-03-30 NOTE — Progress Notes (Signed)
HPI: William Bean is a 61 y.o. male  who presents to [facility] today, 03/30/17,  for chief complaint of:  Chief Complaint  Patient presents with  . Annual Exam      Patient here for annual physical / wellness exam.  See preventive care reviewed as below.  Recent labs reviewed in detail with the patient.   Additional concerns today include:   DM2: Following with endocrinology. A1C elevated today. Pt has some questions about possible DM1 diagnosis as discussed with endocrinologist.Not as adherent to lifestyle measures as he could be. Needs foot exam and eye exam.  Thoracic back pain: hurts with twisting, no numbness/weakness, on R side, worried it might be kidney problem.   Cyst on R thumb: present for some time, deforming his fingernail, would like this removed.    Past medical history, surgical history, social history and family history reviewed.  Patient Active Problem List   Diagnosis Date Noted  . Arthralgia of left temporomandibular joint 12/13/2016  . Abnormal EKG 12/13/2016  . Chest discomfort 04/01/2016  . Right knee pain 09/24/2015  . Erectile dysfunction 02/12/2015  . 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 Chronic renal insufficiency, stage I 08/12/2014  . Neuropathic pain 07/08/2014  . Dyspnea 02/27/2014  . Family history of early CAD 01/24/2014  . Type 2 diabetes mellitus ([place]) 01/21/2014  . Single kidney 01/21/2014  . Numbness of left foot 01/21/2014  . Gout 01/21/2014  . Hyperlipidemia 01/21/2014    Current medication list and allergy/intolerance information reviewed.   Current Outpatient Prescriptions (From endocrine notes 10/2016):  . blood sugar diagnostic (ONETOUCH ULTRA TEST) Strp, by Misc.(Non-Drug; Combo Route) route. Check blood sugar 3 times daily., Disp: , Rfl:  . insulin NPH (HUMULIN N,NOVOLIN N) 100 unit/mL injection, Inject 40 units subcutaneously in the morning and 40 units with supper., Disp: 30 mL, Rfl:  5 . lancets (ONETOUCH DELICA LANCETS) 33 gauge Misc, by Misc.(Non-Drug; Combo Route) route. Use to check blood sugar 3 times daily., Disp: , Rfl:  . lisinopril (PRINIVIL,ZESTRIL) 5 MG tablet, TAKE 1 TABLET BY MOUTH DAILY AS DIRECTED, Disp: 30 tablet, Rfl: 0 . metFORMIN (GLUCOPHAGE) 1000 MG tablet, Take 1,000 mg by mouth 2 times daily., Disp: , Rfl:  . pravastatin (PRAVACHOL) 20 MG tablet, TAKE 1.5 TABLET BY MOUTH EVERY NIGHT AT BEDTIME, Disp: 45 tablet, Rfl: 0 . sildenafil, antihypertensive, (REVATIO) 20 mg tablet, Use 1-2 tablet daily as needed 1/2 hr prior to having sex., Disp: 30 tablet, Rfl: 1  Current Outpatient Prescriptions on File Prior to Visit  Medication Sig Dispense Refill  . albuterol (PROVENTIL HFA;VENTOLIN HFA) 108 (90 Base) MCG/ACT inhaler Inhale two puffs every 4-6 hours only as needed for shortness of breath or wheezing. 1 Inhaler 1  . AMBULATORY NON FORMULARY MEDICATION Glucometer: Verio One Touch.  Use to check blood sugar up to three times a day. 1 Units 0  . aspirin 81 MG tablet Take 81 mg by mouth daily.    . Cinnamon 500 MG capsule Take by mouth.    . dicyclomine (BENTYL) 20 MG tablet Take 1 tablet (20 mg total) by mouth 4 (four) times daily as needed (abdominal/flank pain). 30 tablet 0  . glucose blood (ONETOUCH VERIO) test strip 1 each by Other route 2 (two) times daily. And lancets 2/day 250.01 100 each 12  . ketoconazole (NIZORAL) 2 % shampoo Apply topically 2 (two) times a week. As Needed 120 mL 11  . lisinopril (PRINIVIL,ZESTRIL) 5 MG tablet Take 1  tablet (5 mg total) by mouth daily. 90 tablet 3  . metFORMIN (GLUCOPHAGE) 1000 MG tablet One by mouth twice a day for blood sugar control. 180 tablet 3  . Multiple Vitamin (MULTIVITAMIN) tablet Take 1 tablet by mouth.    . Omega-3 Fatty Acids (FISH OIL) 500 MG CAPS Take 1 tablet by mouth.    . pravastatin (PRAVACHOL) 20 MG tablet Take 1.5 tablets (30 mg total) by mouth daily. 135 tablet 3   No current  facility-administered medications on file prior to visit.    Allergies  Allergen Reactions  . Penicillins     Unknown reaction As a baby      Review of Systems:  Constitutional: No recent illness  HEENT: No  headache, no vision change  Cardiac: No  chest pain, No  pressure, No palpitations  Respiratory:  No  shortness of breath. No  Cough  Gastrointestinal: No  abdominal pain, no change on bowel habits   Musculoskeletal: No new myalgia/arthralgia  Skin: No  Rash  Neurologic: No  weakness, No  Dizziness  Psychiatric: No  concerns with depression, No  concerns with anxiety  Exam:  BP 131/79   Pulse 99   Ht 6' (1.829 m)   Wt 246 lb (111.6 kg)   BMI 33.36 kg/m   Constitutional: VS see above. General Appearance: alert, well-developed, well-nourished, NAD  Eyes: Normal lids and conjunctive, non-icteric sclera  Ears, Nose, Mouth, Throat: MMM, Normal external inspection ears/nares/mouth/lips/gums.  Neck: No masses, trachea midline.   Respiratory: Normal respiratory effort. no wheeze, no rhonchi, no rales  Cardiovascular: S1/S2 normal, no murmur, no rub/gallop auscultated. RRR.   Musculoskeletal: Gait normal. Symmetric and independent movement of all extremities  Neurological: Normal balance/coordination. No tremor.  Skin: warm, dry, intact.   Psychiatric: Normal judgment/insight. Normal mood and affect. Oriented x3.    Recent Results (from the past 2160 hour(s))  POCT HgB A1C     Status: None   Collection Time: 03/30/17 10:31 AM  Result Value Ref Range   Hemoglobin A1C 9.5      ASSESSMENT/PLAN:   Encounter for annual physical exam - preventive  care reviewed as below  Diabetes mellitus without complication ([place]) - following with endocrine, would defer management to them and ask further clarification re: DM1/DM2 and whether/to what extent this affects management.  - Plan: POCT HgB A1C  Chronic right-sided thoracic back pain - advised is not kidney pain,  likely MSK, OTC NSAID advised, RTC if no better, declined recheck renal albd and UA at this time  Digital mucinous cyst - Appreciate assistance of Dr T, see his note for full details     MALE PREVENTIVE CARE  updated 03/30/17  ANNUAL SCREENING/COUNSELING  Any changes to health in the past year? no  Diet/Exercise - HEALTHY HABITS DISCUSSED TO DECREASE CV RISK History  Smoking Status  . Never Smoker  Smokeless Tobacco  . Never Used   History  Alcohol Use No    Comment: former alcholic, last [id number]   Depression screen PHQ 2/9 03/30/2017  Decreased Interest 0  Down, Depressed, Hopeless 0  PHQ - 2 Score 0  Altered sleeping 0  Tired, decreased energy 0  Change in appetite 0  Feeling bad or failure about yourself  0  Trouble concentrating 0  Moving slowly or fidgety/restless 0  Suicidal thoughts 0  PHQ-9 Score 0    SEXUAL/REPRODUCTIVE HEALTH  STI testing needed/desired today? - no  Any concerns with testosterone/libido? - no  INFECTIOUS DISEASE  SCREENING  HIV - does not need  GC/CT - does not need  HepC - does not need  TB - does not need  CANCER SCREENING  Lung - USPSTF: 55-80yo w/ 30 py hx unless quit w/in 62yr - does not need  Colon - does not need  Prostate - does not need  OTHER DISEASE SCREENING  Lipid - does not need  DM2 - does not need  Needs eye exam - going back to [facility] for this   Foot exam 03/30/17   AAA - 65-75yo ever smoked: does not need  Osteoporosis - men 63yo+ - does not need  ADULT VACCINATION  Influenza - annual vaccine recommended  Td - booster every 10 years   Zoster - option at 76, yes at 60+   PCV13 - was not indicated  PPSV23 - already has Immunization History  Administered Date(s) Administered  . Influenza,inj,Quad PF,36+ Mos 08/12/2014, 09/24/2015, 09/16/2016  . Pneumococcal Polysaccharide-23 01/24/2014  . Tdap 01/24/2014      Follow-up plan: Return in about 6 months (around 09/30/2017) for  routine labs and recheck blood pressure. Follow-up as directed with endocrinology .  Visit summary with medication list and pertinent instructions was printed for patient to review, alert [place] if any changes needed. All questions at time of visit were answered - patient instructed to contact office with any additional concerns. ER/RTC precautions were reviewed with the patient and understanding verbalized.   Note: Total time spent addressing non-wellness concerns 15 minutes, greater than 50% of that time was spent face-to-face counseling and coordinating care for the following: The primary encounter diagnosis was Diabetes mellitus without complication ([place]). Diagnoses of Chronic right-sided thoracic back pain and Digital mucinous cyst were also pertinent to this visit.Marland Kitchen

## 2017-04-16 DIAGNOSIS — S6440XA Injury of digital nerve of unspecified finger, initial encounter: Secondary | ICD-10-CM | POA: Diagnosis not present

## 2017-04-16 DIAGNOSIS — Z23 Encounter for immunization: Secondary | ICD-10-CM | POA: Diagnosis not present

## 2017-04-25 ENCOUNTER — Ambulatory Visit (INDEPENDENT_AMBULATORY_CARE_PROVIDER_SITE_OTHER): Payer: BLUE CROSS/BLUE SHIELD | Admitting: Sports Medicine

## 2017-04-25 ENCOUNTER — Encounter: Payer: Self-pay | Admitting: Sports Medicine

## 2017-04-25 DIAGNOSIS — S61213D Laceration without foreign body of left middle finger without damage to nail, subsequent encounter: Secondary | ICD-10-CM

## 2017-04-25 DIAGNOSIS — E1142 Type 2 diabetes mellitus with diabetic polyneuropathy: Secondary | ICD-10-CM

## 2017-04-25 DIAGNOSIS — Z794 Long term (current) use of insulin: Secondary | ICD-10-CM

## 2017-04-25 DIAGNOSIS — S61213A Laceration without foreign body of left middle finger without damage to nail, initial encounter: Secondary | ICD-10-CM | POA: Insufficient documentation

## 2017-04-25 MED ORDER — EMPAGLIFLOZIN-METFORMIN HCL ER 25-1000 MG PO TB24: 1.0000 | ORAL_TABLET | Freq: Every day | ORAL | 11 refills | Status: DC

## 2017-04-25 NOTE — Progress Notes (Signed)
  Subjective:    CC: Finger laceration  HPI: 1 week ago this pleasant 61 year old male had a laceration on his left middle finger, this was closed in urgent care with simple interrupted sutures and is here for a wound check.  Diabetes mellitus type 2: Uncontrolled, on high doses of insulin, has never been tried on a GLP-1 agonist or SGLT2 inhibitor.  I don't see a fasting C-peptide in his chart to suggest complete lack of insulin production.  Past medical history:  Negative.  See flowsheet/record as well for more information.  Surgical history: Negative.  See flowsheet/record as well for more information.  Family history: Negative.  See flowsheet/record as well for more information.  Social history: Negative.  See flowsheet/record as well for more information.  Allergies, and medications have been entered into the medical record, reviewed, and no changes needed.   Review of Systems: No fevers, chills, night sweats, weight loss, chest pain, or shortness of breath.   Objective:    General: Well Developed, well nourished, and in no acute distress.  Neuro: Alert and oriented x3, extra-ocular muscles intact, sensation grossly intact.  HEENT: Normocephalic, atraumatic, pupils equal round reactive to light, neck supple, no masses, no lymphadenopathy, thyroid nonpalpable.  Skin: Warm and dry, no rashes. Cardiac: Regular rate and rhythm, no murmurs rubs or gallops, no lower extremity edema.  Respiratory: Clear to auscultation bilaterally. Not using accessory muscles, speaking in full sentences. Left hand: Linear laceration without surrounding erythema with #3 simple interrupted sutures, the wound edges still poor part with flexion of the finger.  Impression and Recommendations:    Laceration of left middle finger Wound was repaired at emergent care. It's about a week out, not yet ready to remove stitches, he does have noncontrolled diabetes which is interfering with wound healing. I have told  him that he can remove the sutures himself at home or have his wife do it after a week.  Type 2 diabetes mellitus ([place]) I'm curious about his insulin regimen, he's not a true basal insulin. He does have a single kidney, adding Synjardy, GFR is greater than 30. Ultimately I think he should transition to glargine. He will keep track of his blood sugars every morning, I would like to see him back in a couple of weeks. His wound will never heal unless we get his blood sugars under control.  I spent 25 minutes with this patient, greater than 50% was face-to-face time counseling regarding the above diagnoses

## 2017-04-25 NOTE — Assessment & Plan Note (Signed)
Wound was repaired at emergent care. It's about a week out, not yet ready to remove stitches, he does have noncontrolled diabetes which is interfering with wound healing. I have told him that he can remove the sutures himself at home or have his wife do it after a week.

## 2017-04-25 NOTE — Assessment & Plan Note (Signed)
I'm curious about his insulin regimen, he's not a true basal insulin. He does have a single kidney, adding Synjardy, GFR is greater than 30. Ultimately I think he should transition to glargine. He will keep track of his blood sugars every morning, I would like to see him back in a couple of weeks. His wound will never heal unless we get his blood sugars under control.

## 2017-04-27 ENCOUNTER — Ambulatory Visit: Payer: Self-pay | Admitting: Sports Medicine

## 2017-04-27 DIAGNOSIS — T887XXA Unspecified adverse effect of drug or medicament, initial encounter: Secondary | ICD-10-CM | POA: Diagnosis not present

## 2017-04-29 ENCOUNTER — Telehealth: Payer: Self-pay

## 2017-04-29 NOTE — Telephone Encounter (Signed)
Error

## 2017-05-16 ENCOUNTER — Ambulatory Visit: Payer: BLUE CROSS/BLUE SHIELD | Admitting: Osteopathic Medicine

## 2017-05-16 ENCOUNTER — Ambulatory Visit: Payer: BLUE CROSS/BLUE SHIELD | Admitting: Sports Medicine

## 2017-08-03 DIAGNOSIS — F4320 Adjustment disorder, unspecified: Secondary | ICD-10-CM | POA: Diagnosis not present

## 2017-08-17 DIAGNOSIS — F4320 Adjustment disorder, unspecified: Secondary | ICD-10-CM | POA: Diagnosis not present

## 2017-09-05 ENCOUNTER — Other Ambulatory Visit: Payer: Self-pay | Admitting: Osteopathic Medicine

## 2017-09-05 DIAGNOSIS — E1142 Type 2 diabetes mellitus with diabetic polyneuropathy: Secondary | ICD-10-CM

## 2017-09-05 DIAGNOSIS — Z794 Long term (current) use of insulin: Secondary | ICD-10-CM

## 2017-09-19 ENCOUNTER — Encounter: Payer: Self-pay | Admitting: Osteopathic Medicine

## 2017-09-19 ENCOUNTER — Ambulatory Visit (INDEPENDENT_AMBULATORY_CARE_PROVIDER_SITE_OTHER): Payer: BLUE CROSS/BLUE SHIELD

## 2017-09-19 ENCOUNTER — Ambulatory Visit (INDEPENDENT_AMBULATORY_CARE_PROVIDER_SITE_OTHER): Payer: BLUE CROSS/BLUE SHIELD | Admitting: Osteopathic Medicine

## 2017-09-19 VITALS — BP 129/80 | HR 111 | Temp 99.0°F | Ht 72.0 in | Wt 241.0 lb

## 2017-09-19 DIAGNOSIS — Z794 Long term (current) use of insulin: Secondary | ICD-10-CM

## 2017-09-19 DIAGNOSIS — K599 Functional intestinal disorder, unspecified: Secondary | ICD-10-CM | POA: Diagnosis not present

## 2017-09-19 DIAGNOSIS — R05 Cough: Secondary | ICD-10-CM | POA: Diagnosis not present

## 2017-09-19 DIAGNOSIS — R509 Fever, unspecified: Secondary | ICD-10-CM

## 2017-09-19 DIAGNOSIS — Z8709 Personal history of other diseases of the respiratory system: Secondary | ICD-10-CM | POA: Diagnosis not present

## 2017-09-19 DIAGNOSIS — E1142 Type 2 diabetes mellitus with diabetic polyneuropathy: Secondary | ICD-10-CM

## 2017-09-19 DIAGNOSIS — R059 Cough, unspecified: Secondary | ICD-10-CM

## 2017-09-19 HISTORY — DX: Personal history of other diseases of the respiratory system: Z87.09

## 2017-09-19 LAB — POCT INFLUENZA A/B
Influenza A, POC: NEGATIVE
Influenza B, POC: NEGATIVE

## 2017-09-19 MED ORDER — BENZONATATE 200 MG PO CAPS: 200.0000 mg | ORAL_CAPSULE | Freq: Three times a day (TID) | ORAL | 0 refills | Status: DC | PRN

## 2017-09-19 MED ORDER — PREDNISONE 20 MG PO TABS: 20.0000 mg | ORAL_TABLET | Freq: Two times a day (BID) | ORAL | 1 refills | Status: DC

## 2017-09-19 MED ORDER — DICYCLOMINE HCL 20 MG PO TABS: 20.0000 mg | ORAL_TABLET | Freq: Four times a day (QID) | ORAL | 1 refills | Status: DC | PRN

## 2017-09-19 MED ORDER — AZITHROMYCIN 250 MG PO TABS: ORAL_TABLET | ORAL | 0 refills | Status: DC
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
## 2017-09-22 ENCOUNTER — Telehealth: Payer: Self-pay

## 2017-09-22 DIAGNOSIS — J45909 Unspecified asthma, uncomplicated: Secondary | ICD-10-CM

## 2017-09-22 MED ORDER — ALBUTEROL SULFATE HFA 108 (90 BASE) MCG/ACT IN AERS: INHALATION_SPRAY | RESPIRATORY_TRACT | 1 refills | Status: DC

## 2017-09-22 NOTE — Telephone Encounter (Signed)
Patient request refill for Albuterol. It was approved and sent to [facility]. Truett Mcfarlan,CMA

## 2017-09-27 ENCOUNTER — Ambulatory Visit: Payer: Self-pay

## 2017-09-28 ENCOUNTER — Encounter: Payer: Self-pay | Admitting: Osteopathic Medicine

## 2017-09-28 ENCOUNTER — Ambulatory Visit (INDEPENDENT_AMBULATORY_CARE_PROVIDER_SITE_OTHER): Payer: BLUE CROSS/BLUE SHIELD | Admitting: Osteopathic Medicine

## 2017-09-28 VITALS — BP 124/71 | HR 112 | Temp 98.0°F | Wt 237.0 lb

## 2017-09-28 DIAGNOSIS — Z794 Long term (current) use of insulin: Secondary | ICD-10-CM | POA: Diagnosis not present

## 2017-09-28 DIAGNOSIS — J45909 Unspecified asthma, uncomplicated: Secondary | ICD-10-CM | POA: Diagnosis not present

## 2017-09-28 DIAGNOSIS — Z8709 Personal history of other diseases of the respiratory system: Secondary | ICD-10-CM | POA: Diagnosis not present

## 2017-09-28 DIAGNOSIS — E1142 Type 2 diabetes mellitus with diabetic polyneuropathy: Secondary | ICD-10-CM

## 2017-09-28 LAB — POCT GLYCOSYLATED HEMOGLOBIN (HGB A1C): HEMOGLOBIN A1C: 9.2
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
## 2017-09-29 LAB — COMPLETE METABOLIC PANEL WITH GFR
AG RATIO: 1.6 (calc) (ref 1.0–2.5)
ALBUMIN MSPROF: 4.2 g/dL (ref 3.6–5.1)
ALT: 16 U/L (ref 9–46)
AST: 11 U/L (ref 10–35)
Alkaline phosphatase (APISO): 113 U/L (ref 40–115)
BUN: 19 mg/dL (ref 7–25)
CHLORIDE: 98 mmol/L (ref 98–110)
CO2: 29 mmol/L (ref 20–32)
Calcium: 9.7 mg/dL (ref 8.6–10.3)
Creat: 1.13 mg/dL (ref 0.70–1.25)
GFR, EST AFRICAN AMERICAN: 80 mL/min/{1.73_m2} (ref >=60)
GFR, Est Non African American: 69 mL/min/{1.73_m2} (ref >=60)
Globulin: 2.6 g/dL (calc) (ref 1.9–3.7)
Glucose, Bld: 470 mg/dL — ABNORMAL HIGH (ref 65–99)
POTASSIUM: 4.7 mmol/L (ref 3.5–5.3)
Sodium: 138 mmol/L (ref 135–146)
TOTAL PROTEIN: 6.8 g/dL (ref 6.1–8.1)
Total Bilirubin: 0.6 mg/dL (ref 0.2–1.2)

## 2017-09-29 LAB — TSH: TSH: 2.05 m[IU]/L (ref 0.40–4.50)

## 2017-09-29 LAB — VITAMIN D 25 HYDROXY (VIT D DEFICIENCY, FRACTURES): VIT D 25 HYDROXY: 44 ng/mL (ref 30–100)

## 2017-09-29 LAB — LIPID PANEL
Cholesterol: 151 mg/dL (ref <200)
HDL: 44 mg/dL (ref >40)
LDL CHOLESTEROL (CALC): 73 mg/dL
NON-HDL CHOLESTEROL (CALC): 107 mg/dL (ref <130)
Total CHOL/HDL Ratio: 3.4 (calc) (ref <5.0)
Triglycerides: 246 mg/dL — ABNORMAL HIGH (ref <150)

## 2017-09-29 LAB — CBC
HEMATOCRIT: 44.2 % (ref 38.5–50.0)
Hemoglobin: 14.5 g/dL (ref 13.2–17.1)
MCH: 28.5 pg (ref 27.0–33.0)
MCHC: 32.8 g/dL (ref 32.0–36.0)
MCV: 86.8 fL (ref 80.0–100.0)
MPV: 10.8 fL (ref 7.5–12.5)
Platelets: 217 10*3/uL (ref 140–400)
RBC: 5.09 10*6/uL (ref 4.20–5.80)
RDW: 12.8 % (ref 11.0–15.0)
WBC: 11.4 10*3/uL — AB (ref 3.8–10.8)

## 2017-09-30 ENCOUNTER — Telehealth: Payer: Self-pay | Admitting: Family Medicine

## 2017-09-30 NOTE — Telephone Encounter (Signed)
I received a call from the lab about a critical value. His blood sugar was 470 on 11/21. He was recently given prednisone for asthma exacerbation.  I called him and he feels well. I advised to check blood sugars and increase insulin accordingly.  Check back with PCP Monday.

## 2017-10-05 DIAGNOSIS — F4320 Adjustment disorder, unspecified: Secondary | ICD-10-CM | POA: Diagnosis not present

## 2017-12-04 ENCOUNTER — Other Ambulatory Visit: Payer: Self-pay | Admitting: Osteopathic Medicine

## 2017-12-04 DIAGNOSIS — E1142 Type 2 diabetes mellitus with diabetic polyneuropathy: Secondary | ICD-10-CM

## 2017-12-04 DIAGNOSIS — Z794 Long term (current) use of insulin: Secondary | ICD-10-CM

## 2017-12-05 NOTE — Telephone Encounter (Signed)
Pharmacy requesting refill for metformin 1000 mg. It appears pt is taking empagliflozin / metformin 25 - 1000 mg. Pls advise, if appropriate. Thanks.

## 2017-12-06 NOTE — Telephone Encounter (Signed)
Reviewed medication list, I do not see that he is taking just metformin.  I believe he was previously under the care of an endocrinologist anyway.  I would deny the refill request.

## 2017-12-25 ENCOUNTER — Other Ambulatory Visit: Payer: Self-pay | Admitting: Osteopathic Medicine

## 2017-12-25 DIAGNOSIS — E1142 Type 2 diabetes mellitus with diabetic polyneuropathy: Secondary | ICD-10-CM

## 2017-12-25 DIAGNOSIS — Z794 Long term (current) use of insulin: Secondary | ICD-10-CM

## 2017-12-27 ENCOUNTER — Ambulatory Visit: Payer: BLUE CROSS/BLUE SHIELD | Admitting: Osteopathic Medicine

## 2017-12-27 ENCOUNTER — Encounter: Payer: Self-pay | Admitting: Osteopathic Medicine

## 2017-12-27 VITALS — BP 137/61 | HR 92 | Temp 98.2°F | Wt 249.0 lb

## 2017-12-27 DIAGNOSIS — Z23 Encounter for immunization: Secondary | ICD-10-CM | POA: Diagnosis not present

## 2017-12-27 DIAGNOSIS — Z794 Long term (current) use of insulin: Secondary | ICD-10-CM

## 2017-12-27 DIAGNOSIS — R0789 Other chest pain: Secondary | ICD-10-CM | POA: Diagnosis not present

## 2017-12-27 DIAGNOSIS — E1142 Type 2 diabetes mellitus with diabetic polyneuropathy: Secondary | ICD-10-CM | POA: Diagnosis not present

## 2017-12-27 DIAGNOSIS — H9193 Unspecified hearing loss, bilateral: Secondary | ICD-10-CM | POA: Diagnosis not present

## 2017-12-27 LAB — POCT GLYCOSYLATED HEMOGLOBIN (HGB A1C): Hemoglobin A1C: 7.4

## 2017-12-27 NOTE — Progress Notes (Signed)
HPI: William Bean is a 62 y.o. male who  has a past medical history of Asthma, Gout, History of asthma (09/19/2017), Hyperlipidemia, Pyloric stenosis, Single kidney, and Type 2 diabetes mellitus ([name]).  he presents to [facility] today, 12/27/17,  for chief complaint of: Recheck sugars HTN Chest discomfort   Concern w/ high BP - phone numbers have been reading a little bit higher up in the 140s but numbers here in the office today look good.   Chest pain: with stress only, patient reports occasional chest discomfort but no angina on exertion, no claudication, no difficulty breathing, no headache or vision change.   DIABETES SCREENING/PREVENTIVE CARE: Feels irritable with low Glc  Has appt in [place] 03/08/2018  A1C past 3-6 mos: Yes  controlled? No   09/28/17: 9.2%  12/27/17: 7.4%  BP goal <130/80: Yes  LDL goal <70: close was 73 in 09/2017 Eye exam annually: none on file, importance discussed with patient Foot exam: Yes  Microalbuminuria:No  Metformin: Yes  ACE/ARB: Yes  Antiplatelet if ASCVD Risk >10%: Yes  Statin: Yes  Pneumovax: Yes      Past medical, surgical, social and family history reviewed:  Social History   Tobacco Use  . Smoking status: Never Smoker  . Smokeless tobacco: Never Used  Substance Use Topics  . Alcohol use: No    Alcohol/week: 0.0 oz    Comment: former alcholic, last [id number]    Family History  Problem Relation Age of Onset  . Alcohol abuse Father   . Heart attack Father        MI at age 72  . Diabetes Father        Deceased  . Healthy Mother        Living, 21  . Diabetes Mellitus II Brother   . Rheum arthritis Sister   . Other Daughter        POTS, IgA nephropathy  . Eating disorder Daughter   . Healthy Son      Current medication list and allergy/intolerance information reviewed:    Current Outpatient Medications  Medication Sig Dispense Refill  . albuterol (PROVENTIL HFA;VENTOLIN  HFA) 108 (90 Base) MCG/ACT inhaler Inhale two puffs every 4-6 hours only as needed for shortness of breath or wheezing. 1 Inhaler 1  . AMBULATORY NON FORMULARY MEDICATION Glucometer: Verio One Touch.  Use to check blood sugar up to three times a day. 1 Units 0  . AMBULATORY NON FORMULARY MEDICATION Take 3 capsules per day    . aspirin 81 MG tablet Take 81 mg by mouth daily.    . benzonatate (TESSALON) 200 MG capsule Take 1 capsule (200 mg total) 3 (three) times daily as needed by mouth for cough. 20 capsule 0  . Cinnamon 500 MG capsule Take by mouth.    . dicyclomine (BENTYL) 20 MG tablet Take 1 tablet (20 mg total) 4 (four) times daily as needed by mouth (abdominal/flank pain). 30 tablet 1  . Empagliflozin-Metformin HCl ER (SYNJARDY XR) 25-1000 MG TB24 Take 1 tablet by mouth daily. 30 tablet 11  . glucose blood (ONETOUCH VERIO) test strip 1 each by Other route 2 (two) times daily. And lancets 2/day 250.01 100 each 12  . insulin NPH Human (HUMULIN N,NOVOLIN N) 100 UNIT/ML injection Inject 35 Units into the skin. TAKE 35 UNITS IN THE MORNING AND 35 UNITS AT NIGHT    . ketoconazole (NIZORAL) 2 % shampoo Apply topically 2 (two) times a week. As Needed 120 mL  11  . lisinopril (PRINIVIL,ZESTRIL) 5 MG tablet TAKE ONE TABLET BY MOUTH DAILY 90 tablet 2  . metFORMIN (GLUCOPHAGE) 1000 MG tablet TAKE ONE TABLET BY MOUTH TWICE A DAY FOR BLOOD SUGAR 180 tablet 2  . Multiple Vitamin (MULTIVITAMIN) tablet Take 1 tablet by mouth.    . Omega-3 Fatty Acids (FISH OIL) 500 MG CAPS Take 1 tablet by mouth.    . pravastatin (PRAVACHOL) 20 MG tablet TAKE 1 AND 1/2 TABLETS BY MOUTH DAILY 135 tablet 1  . predniSONE (DELTASONE) 20 MG tablet Take 1 tablet (20 mg total) 2 (two) times daily with a meal by mouth. 10 tablet 1   No current facility-administered medications for this visit.     Allergies  Allergen Reactions  . Penicillins Other (See Comments)    Unknown reaction As a baby       Review of  Systems:  Constitutional:  No  fever, no chills, No recent illness  HEENT: No  headache, no vision change  Cardiac: +chest pain, No  pressure, No palpitations, No  Orthopnea  Respiratory:  No  shortness of breath. No  Cough  Gastrointestinal: No  abdominal pain, No  nausea  Musculoskeletal: No new myalgia/arthralgia  Skin: No  Rash  Neurologic: No  weakness, No  dizziness  Psychiatric: No  concerns with depression, +concerns with stress, No sleep problems, No mood problems  Exam:  BP 137/61   Pulse 92   Temp 98.2 F (36.8 C) (Oral)   Wt 249 lb 0.6 oz (113 kg)   BMI 33.78 kg/m   Constitutional: VS see above. General Appearance: alert, well-developed, well-nourished, NAD  Eyes: Normal lids and conjunctive, non-icteric sclera  Ears, Nose, Mouth, Throat: MMM, Normal external inspection ears/nares/mouth/lips/gums.   Neck: No masses, trachea midline.   Respiratory: Normal respiratory effort. no wheeze, no rhonchi, no rales  Cardiovascular: S1/S2 normal, no murmur, no rub/gallop auscultated. RRR. No lower extremity edema.   Musculoskeletal: Gait normal.   Neurological: Normal balance/coordination. No tremor.   Skin: warm, dry, intact.  Psychiatric: Normal judgment/insight. Normal mood and affect. Oriented x3.    EKG interpretation: Rate: 94 Rhythm: sinus No ST/T changes concerning for acute ischemia/infarct  Previous EKG about same    Results for orders placed or performed in visit on 12/27/17 (from the past 72 hour(s))  POCT HgB A1C     Status: None   Collection Time: 12/27/17 10:26 AM  Result Value Ref Range   Hemoglobin A1C 7.4       ASSESSMENT/PLAN:   Type 2 diabetes mellitus with diabetic polyneuropathy, with long-term current use of insulin ([place]) - Was unhappy with previous endocrinologist, has an appointment upcoming with a different practitioner. - Plan: POCT HgB A1C  Need for influenza vaccination - Plan: Flu Vaccine QUAD 6+ mos PF IM (Fluarix  Quad PF)  Other chest pain - Seems mostly stress/anxiety related, patient states negative stress test in the past, we'll go ahead and refer to cardiology for consideration of repeat stress  - Plan: EKG 12-Lead, Ambulatory referral to Cardiology  Diminished hearing, bilateral - Plan: Ambulatory referral to Audiology     Visit summary with medication list and pertinent instructions was printed for patient to review. All questions at time of visit were answered - patient instructed to contact office with any additional concerns. ER/RTC precautions were reviewed with the patient.   Follow-up plan: Return in about 3 months (around 03/26/2018) for recheck BP .    Please note: voice recognition software was  used to produce this document, and typos may escape review. Please contact Dr. Sheppard Coil for any needed clarifications.

## 2017-12-28 ENCOUNTER — Telehealth: Payer: Self-pay

## 2017-12-28 MED ORDER — AMBULATORY NON FORMULARY MEDICATION: 99 refills | Status: DC

## 2017-12-28 NOTE — Telephone Encounter (Signed)
At patient's request HPR Plus cream rx mailed to:  [facility] [address] [place] [address]
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
## 2018-01-09 ENCOUNTER — Telehealth: Payer: Self-pay | Admitting: Osteopathic Medicine

## 2018-01-09 DIAGNOSIS — E1142 Type 2 diabetes mellitus with diabetic polyneuropathy: Secondary | ICD-10-CM

## 2018-01-09 DIAGNOSIS — Z794 Long term (current) use of insulin: Secondary | ICD-10-CM

## 2018-01-09 NOTE — Telephone Encounter (Signed)
Referral sent for ophthalmologic For new abdominal pain, he should probably schedule a visit here first, there may be some workup that we can do in advance of a GI visit, and also rule out anything serious in the interim. Please have him schedule an appointment

## 2018-01-09 NOTE — Telephone Encounter (Signed)
Dr. Sheppard Coil.     Francisco called and stated he would like for his eye referral to be sent to [facility] Dr. Burt Knack at [phone]. He also stated he has been having episodes of trapped gas in his middle abdomen that feels like a knife in his stomach or back and they last for an hour or so and it happens several times. He wanted to know if he could also have a referral to a GI for this issue.   Jenny Reichmann

## 2018-01-11 ENCOUNTER — Encounter: Payer: Self-pay | Admitting: Cardiology

## 2018-01-11 ENCOUNTER — Ambulatory Visit: Payer: BLUE CROSS/BLUE SHIELD | Admitting: Cardiology

## 2018-01-11 VITALS — BP 134/76 | HR 75 | Ht 73.0 in | Wt 254.0 lb

## 2018-01-11 DIAGNOSIS — E7800 Pure hypercholesterolemia, unspecified: Secondary | ICD-10-CM | POA: Diagnosis not present

## 2018-01-11 DIAGNOSIS — I10 Essential (primary) hypertension: Secondary | ICD-10-CM

## 2018-01-11 DIAGNOSIS — R072 Precordial pain: Secondary | ICD-10-CM

## 2018-01-11 NOTE — Telephone Encounter (Signed)
Left VM with update.  

## 2018-01-11 NOTE — Patient Instructions (Signed)
Medication Instructions:   NO CHANGE  Testing/Procedures:  Your physician has requested that you have en exercise stress myoview. For further information please visit [email or web address]. Please follow instruction sheet, as given.TAKE ALL MEDICATIONS    Follow-Up:  Your physician wants you to follow-up in: [place] will receive a reminder letter in the mail two months in advance. If you don't receive a letter, please call our office to schedule the follow-up appointment.   If you need a refill on your cardiac medications before your next appointment, please call your pharmacy.

## 2018-01-20 ENCOUNTER — Telehealth (HOSPITAL_COMMUNITY): Payer: Self-pay

## 2018-01-20 NOTE — Telephone Encounter (Signed)
Encounter complete. 

## 2018-01-25 ENCOUNTER — Ambulatory Visit (HOSPITAL_COMMUNITY)
Admission: RE | Admit: 2018-01-25 | Discharge: 2018-01-25 | Disposition: A | Discharge status: Home / Self Care | Payer: BLUE CROSS/BLUE SHIELD | Source: Ambulatory Visit | Attending: Cardiovascular Disease | Admitting: Cardiovascular Disease

## 2018-01-25 DIAGNOSIS — R072 Precordial pain: Secondary | ICD-10-CM

## 2018-01-25 LAB — MYOCARDIAL PERFUSION IMAGING
CHL CUP MPHR: 157 {beats}/min
CHL RATE OF PERCEIVED EXERTION: 18
CSEPED: 7 min
Estimated workload: 8.5 METS
Exercise duration (sec): 30 s
LVDIAVOL: 75 mL (ref 62–150)
LVSYSVOL: 26 mL
NUC STRESS TID: 0.92
Peak HR: 148 {beats}/min
Percent HR: 94 %
Rest HR: 97 {beats}/min
SDS: 1
SRS: 0
SSS: 1

## 2018-01-25 MED ORDER — TECHNETIUM TC 99M TETROFOSMIN IV KIT
11.0000 | PACK | Freq: Once | INTRAVENOUS | Status: AC | PRN
  Administered 2018-01-25: 11 via INTRAVENOUS
  Filled 2018-01-25: qty 11

## 2018-01-25 MED ORDER — TECHNETIUM TC 99M TETROFOSMIN IV KIT
32.9000 | PACK | Freq: Once | INTRAVENOUS | Status: AC | PRN
  Administered 2018-01-25: 32.9 via INTRAVENOUS
  Filled 2018-01-25: qty 33

## 2018-02-14 ENCOUNTER — Telehealth: Payer: Self-pay

## 2018-02-14 NOTE — Telephone Encounter (Signed)
Pt called stating he's had 2 "stomach attacks" in the last 4 days. As per pt, pain is unbearable. Requesting a referral for GI. Pt did not request a specific location, but wants an appt as soon as possible.

## 2018-02-14 NOTE — Telephone Encounter (Signed)
I typically don't approve referrals without an office visit to evaluate the problem, and neither does insurance. Patient needs appointment - maybe there is something I can do to help

## 2018-02-14 NOTE — Telephone Encounter (Signed)
Attempted to contact pt regarding provider's note, no answer. Unable to leave vm msg - box is "full".

## 2018-02-15 ENCOUNTER — Other Ambulatory Visit: Payer: Self-pay | Admitting: Osteopathic Medicine

## 2018-02-15 NOTE — Telephone Encounter (Signed)
Waiting on patient to call back with new Ophthalmologist due to [facility] is no longer accepting new patients - [facility]

## 2018-02-16 ENCOUNTER — Telehealth: Payer: Self-pay | Admitting: Osteopathic Medicine

## 2018-02-16 DIAGNOSIS — R1084 Generalized abdominal pain: Secondary | ICD-10-CM

## 2018-02-16 NOTE — Telephone Encounter (Signed)
Referral sent I'm still happy to see him if needed

## 2018-02-16 NOTE — Telephone Encounter (Signed)
Pt called and stated you all have talked about him going to GI and he is wanting to know if he can go ahead and get a referral to GI.Thanks

## 2018-03-08 DIAGNOSIS — I10 Essential (primary) hypertension: Secondary | ICD-10-CM | POA: Diagnosis not present

## 2018-03-08 DIAGNOSIS — E1142 Type 2 diabetes mellitus with diabetic polyneuropathy: Secondary | ICD-10-CM | POA: Diagnosis not present

## 2018-03-08 DIAGNOSIS — F5221 Male erectile disorder: Secondary | ICD-10-CM | POA: Diagnosis not present

## 2018-03-08 DIAGNOSIS — E785 Hyperlipidemia, unspecified: Secondary | ICD-10-CM | POA: Diagnosis not present

## 2018-03-08 DIAGNOSIS — E1165 Type 2 diabetes mellitus with hyperglycemia: Secondary | ICD-10-CM | POA: Diagnosis not present

## 2018-03-27 ENCOUNTER — Ambulatory Visit: Payer: BLUE CROSS/BLUE SHIELD | Admitting: Osteopathic Medicine

## 2018-03-27 ENCOUNTER — Encounter: Payer: Self-pay | Admitting: Osteopathic Medicine

## 2018-03-27 VITALS — BP 135/80 | HR 86 | Temp 98.0°F | Wt 248.4 lb

## 2018-03-27 DIAGNOSIS — R29898 Other symptoms and signs involving the musculoskeletal system: Secondary | ICD-10-CM | POA: Insufficient documentation

## 2018-03-27 DIAGNOSIS — E1142 Type 2 diabetes mellitus with diabetic polyneuropathy: Secondary | ICD-10-CM

## 2018-03-27 DIAGNOSIS — Z794 Long term (current) use of insulin: Secondary | ICD-10-CM | POA: Diagnosis not present

## 2018-03-27 DIAGNOSIS — F418 Other specified anxiety disorders: Secondary | ICD-10-CM

## 2018-03-27 DIAGNOSIS — R0789 Other chest pain: Secondary | ICD-10-CM

## 2018-03-27 LAB — POCT GLYCOSYLATED HEMOGLOBIN (HGB A1C): HEMOGLOBIN A1C: 7.9

## 2018-03-27 MED ORDER — AMBULATORY NON FORMULARY MEDICATION: 6 refills | Status: DC

## 2018-03-27 MED ORDER — ALPRAZOLAM 0.5 MG PO TABS: 0.5000 mg | ORAL_TABLET | Freq: Three times a day (TID) | ORAL | 0 refills | Status: DC | PRN

## 2018-03-27 NOTE — Progress Notes (Signed)
HPI: William Bean is a 62 y.o. male who  has a past medical history of Asthma, Gout, History of asthma (09/19/2017), Hyperlipidemia, Hypertension, Pyloric stenosis, Single kidney, and Type 2 diabetes mellitus ([place]).  he presents to [facility] today, 03/27/18,  for chief complaint of:  BP recheck  DM2: referred to endocrine per patient request - he is seeing [place], appt 03/08/18 per referral notes but I don't have records available at this time, metformin recently d/c'ed. A1C today 7.9%  CP: recently seen by cardiology, normal nuclear stress test 01/25/18  HTN: improved on recheck  Arm pain, decreased grip strength: injury years ago catching a large barrel that fell onto this L side, reports worsening pain on that side I nL shoulder/neck and forearm and now losing grip strength, dropping items, neuropathy feels worse on that side in LE/foot. Inquires about neurology referral.   Anxiety: will be going on a plane for the first time in awhile, requests anxiolytic.     Past medical history, surgical history, and family history reviewed.  Current medication list and allergy/intolerance information reviewed.   (See remainder of HPI, ROS, Phys Exam below)    Results for orders placed or performed in visit on 03/27/18 (from the past 72 hour(s))  POCT HgB A1C     Status: None   Collection Time: 03/27/18  9:51 AM  Result Value Ref Range   Hemoglobin A1C 7.9      ASSESSMENT/PLAN:   Type 2 diabetes mellitus with diabetic polyneuropathy, with long-term current use of insulin ([place]) - advised when next sees endocrine, have them fax me report - Plan: POCT HgB A1C  Chest discomfort - f/u w/ cardiology as needed, pt states better w/ dietary changes  Decreased grip strength of left hand - Plan: Ambulatory referral to Neurology  Situational anxiety   Meds ordered this encounter  Medications  . ALPRAZolam (XANAX) 0.5 MG tablet    Sig: Take 1 tablet  (0.5 mg total) by mouth 3 (three) times daily as needed for anxiety.    Dispense:  10 tablet    Refill:  0  . AMBULATORY NON FORMULARY MEDICATION    Sig: HPR Plus Cream, Sig: 1 application once or twice daily as needed for dry irritated skin    Dispense:  1 Units    Refill:  6     Follow-up plan: Return in about 3 months (around 06/27/2018) for recheck BP, sooner if needed .     ############################################ ############################################ ############################################ ############################################    Outpatient Encounter Medications as of 03/27/2018  Medication Sig Note  . albuterol (PROVENTIL HFA;VENTOLIN HFA) 108 (90 Base) MCG/ACT inhaler Inhale two puffs every 4-6 hours only as needed for shortness of breath or wheezing.   . AMBULATORY NON FORMULARY MEDICATION Glucometer: Verio One Touch.  Use to check blood sugar up to three times a day.   . AMBULATORY NON FORMULARY MEDICATION Take 3 capsules per day   . AMBULATORY NON FORMULARY MEDICATION HPR Plus Cream, Sig: 1 application once or twice daily as needed for dru irritated skin. Disp: 450 g. Refill: 11. Send to [facility] [address] [place] [address]   . aspirin 81 MG tablet Take 81 mg by mouth daily. 09/28/2017: Patient taking one tablet in the am and 1 tablet in the evening   . benzonatate (TESSALON) 200 MG capsule Take 1 capsule (200 mg total) 3 (three) times daily as needed by mouth for cough.   . Cinnamon 500  MG capsule Take by mouth. 09/28/2017: Takes 3 capsules per day  . dicyclomine (BENTYL) 20 MG tablet Take 1 tablet (20 mg total) 4 (four) times daily as needed by mouth (abdominal/flank pain).   . Empagliflozin-Metformin HCl ER (SYNJARDY XR) 25-1000 MG TB24 Take 1 tablet by mouth daily. 09/28/2017: Patient is taking two tablets per dau   . glucose blood (ONETOUCH VERIO) test strip 1 each by Other route 2 (two) times daily. And lancets 2/day 250.01   .  insulin NPH Human (HUMULIN N,NOVOLIN N) 100 UNIT/ML injection Inject 40 Units into the skin 2 (two) times daily.   Marland Kitchen ketoconazole (NIZORAL) 2 % shampoo Apply topically 2 (two) times a week. As Needed   . lisinopril (PRINIVIL,ZESTRIL) 5 MG tablet TAKE ONE TABLET BY MOUTH DAILY   . Multiple Vitamin (MULTIVITAMIN) tablet Take 1 tablet by mouth. 09/28/2017: Patient is taking 3 multivitamins a day  . Omega-3 Fatty Acids (FISH OIL) 500 MG CAPS Take 1 tablet by mouth. 09/28/2017: Taking 4 a day   . pravastatin (PRAVACHOL) 20 MG tablet TAKE ONE AND ONE-HALF (1 AND 1/2) TABLETS BY MOUTH DAILY    No facility-administered encounter medications on file as of 03/27/2018.    Allergies  Allergen Reactions  . Penicillins Other (See Comments)    Unknown reaction As a baby  Unknown reaction      Review of Systems:  Constitutional: No recent illness  HEENT: No  headache, no vision change  Cardiac: No  chest pain, No  pressure, No palpitations  Respiratory:  No  shortness of breath. No  Cough  Gastrointestinal: No  abdominal pain, no change on bowel habits  Musculoskeletal: +new myalgia/arthralgia  Neurologic: +hand/arm weakness as per HPI, No  Dizziness  Psychiatric: No  concerns with depression, No  concerns with anxiety  Exam:  BP (!) 150/69 (BP Location: Left Arm, Patient Position: Sitting, Cuff Size: Large)   Pulse 86   Temp 98 F (36.7 C) (Oral)   Wt 248 lb 6.4 oz (112.7 kg)   BMI 32.77 kg/m   Constitutional: VS see above. General Appearance: alert, well-developed, well-nourished, NAD  Eyes: Normal lids and conjunctive, non-icteric sclera  Ears, Nose, Mouth, Throat: MMM, Normal external inspection ears/nares/mouth/lips/gums.  Neck: No masses, trachea midline.   Respiratory: Normal respiratory effort. no wheeze, no rhonchi, no rales  Cardiovascular: S1/S2 normal, no murmur, no rub/gallop auscultated. RRR.   Musculoskeletal: Gait normal. Symmetric and independent movement of  all extremities  Neurological: Normal balance/coordination. No tremor. Neg Spurling's to L, normal strength 5/5 all extremities, grip strength 5/5 but may be very mildly diminished I nL compared to R   Skin: warm, dry, intact.   Psychiatric: Normal judgment/insight. Normal mood and affect. Oriented x3.   Visit summary with medication list and pertinent instructions was printed for patient to review, advised to alert [place] if any changes needed. All questions at time of visit were answered - patient instructed to contact office with any additional concerns. ER/RTC precautions were reviewed with the patient and understanding verbalized.   Follow-up plan: Return in about 3 months (around 06/27/2018) for recheck BP, sooner if needed .  Note: Total time spent 25 minutes, greater than 50% of the visit was spent face-to-face counseling and coordinating care for the following: The primary encounter diagnosis was Type 2 diabetes mellitus with diabetic polyneuropathy, with long-term current use of insulin ([place]). Diagnoses of Chest discomfort, Decreased grip strength of left hand, and Situational anxiety were also pertinent to this visit.Marland Kitchen  Please note: voice recognition software was used to produce this document, and typos may escape review. Please contact Dr. Sheppard Coil for any needed clarifications.

## 2018-04-10 ENCOUNTER — Ambulatory Visit: Payer: BLUE CROSS/BLUE SHIELD | Admitting: Audiology

## 2018-04-20 ENCOUNTER — Ambulatory Visit: Payer: BLUE CROSS/BLUE SHIELD | Admitting: Audiology

## 2018-04-26 ENCOUNTER — Telehealth: Payer: Self-pay | Admitting: Osteopathic Medicine

## 2018-04-26 DIAGNOSIS — M25512 Pain in left shoulder: Principal | ICD-10-CM

## 2018-04-26 DIAGNOSIS — G8929 Other chronic pain: Secondary | ICD-10-CM

## 2018-04-26 NOTE — Telephone Encounter (Signed)
Order signed. THanks

## 2018-04-26 NOTE — Telephone Encounter (Signed)
Pt called clinic requesting referral to Ortho. 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 Denied option for sports med appointment. Will route.

## 2018-04-27 NOTE — Telephone Encounter (Signed)
Left pt msg on ID'd VM that referral was placed and he will be hearing from them to schedule. Call back info provided in case of any further needs

## 2018-05-04 ENCOUNTER — Ambulatory Visit: Payer: Self-pay | Admitting: Neurology

## 2018-05-04 DIAGNOSIS — E1065 Type 1 diabetes mellitus with hyperglycemia: Secondary | ICD-10-CM | POA: Diagnosis not present

## 2018-05-04 DIAGNOSIS — E1165 Type 2 diabetes mellitus with hyperglycemia: Secondary | ICD-10-CM | POA: Diagnosis not present

## 2018-05-04 DIAGNOSIS — E669 Obesity, unspecified: Secondary | ICD-10-CM | POA: Diagnosis not present

## 2018-05-04 DIAGNOSIS — F5221 Male erectile disorder: Secondary | ICD-10-CM | POA: Diagnosis not present

## 2018-05-04 DIAGNOSIS — Z6833 Body mass index (BMI) 33.0-33.9, adult: Secondary | ICD-10-CM | POA: Diagnosis not present

## 2018-05-15 ENCOUNTER — Encounter: Payer: Self-pay | Admitting: Osteopathic Medicine

## 2018-05-17 ENCOUNTER — Telehealth: Payer: Self-pay | Admitting: Osteopathic Medicine

## 2018-05-17 NOTE — Telephone Encounter (Signed)
Pt left VM inquiring about his Ortho referral. Spoke with our referral coordinator - was advised we were waiting for the Pt to let us know a location since he lives out of town.  Attempted to contact Pt to get update, no answer and VM is full.

## 2018-05-17 NOTE — Telephone Encounter (Signed)
Attempted contact again, no answer and VM is full.

## 2018-05-17 NOTE — Telephone Encounter (Signed)
Noted, let's try to contact him again later today and if still a problem will send letter or await a call back.

## 2018-05-19 NOTE — Telephone Encounter (Signed)
Spoke with Pt. He would like to go to:  Blaine Hamper MD Address: [address], [place], [place] [address] Phone: [phone]

## 2018-05-23 NOTE — Telephone Encounter (Signed)
Done referral sent - CF

## 2018-05-25 DIAGNOSIS — E109 Type 1 diabetes mellitus without complications: Secondary | ICD-10-CM | POA: Diagnosis not present

## 2018-05-25 DIAGNOSIS — H524 Presbyopia: Secondary | ICD-10-CM | POA: Diagnosis not present

## 2018-05-25 DIAGNOSIS — H2513 Age-related nuclear cataract, bilateral: Secondary | ICD-10-CM | POA: Diagnosis not present

## 2018-05-29 ENCOUNTER — Other Ambulatory Visit: Payer: Self-pay

## 2018-05-29 DIAGNOSIS — E1142 Type 2 diabetes mellitus with diabetic polyneuropathy: Secondary | ICD-10-CM

## 2018-05-29 DIAGNOSIS — Z794 Long term (current) use of insulin: Secondary | ICD-10-CM

## 2018-05-29 MED ORDER — PRAVASTATIN SODIUM 20 MG PO TABS: ORAL_TABLET | ORAL | 0 refills | Status: DC

## 2018-05-29 MED ORDER — LISINOPRIL 5 MG PO TABS: 5.0000 mg | ORAL_TABLET | Freq: Every day | ORAL | 0 refills | Status: DC

## 2018-06-08 DIAGNOSIS — M7502 Adhesive capsulitis of left shoulder: Secondary | ICD-10-CM | POA: Diagnosis not present

## 2018-06-08 DIAGNOSIS — M25512 Pain in left shoulder: Secondary | ICD-10-CM | POA: Diagnosis not present

## 2018-06-08 DIAGNOSIS — M25511 Pain in right shoulder: Secondary | ICD-10-CM | POA: Diagnosis not present

## 2018-06-08 DIAGNOSIS — M19011 Primary osteoarthritis, right shoulder: Secondary | ICD-10-CM | POA: Diagnosis not present

## 2018-06-21 DIAGNOSIS — Z794 Long term (current) use of insulin: Secondary | ICD-10-CM | POA: Diagnosis not present

## 2018-06-21 DIAGNOSIS — N189 Chronic kidney disease, unspecified: Secondary | ICD-10-CM | POA: Diagnosis not present

## 2018-06-21 DIAGNOSIS — M479 Spondylosis, unspecified: Secondary | ICD-10-CM | POA: Diagnosis not present

## 2018-06-21 DIAGNOSIS — E1122 Type 2 diabetes mellitus with diabetic chronic kidney disease: Secondary | ICD-10-CM | POA: Diagnosis not present

## 2018-06-21 DIAGNOSIS — M7502 Adhesive capsulitis of left shoulder: Secondary | ICD-10-CM | POA: Diagnosis not present

## 2018-06-21 DIAGNOSIS — M19032 Primary osteoarthritis, left wrist: Secondary | ICD-10-CM | POA: Diagnosis not present

## 2018-06-21 DIAGNOSIS — I129 Hypertensive chronic kidney disease with stage 1 through stage 4 chronic kidney disease, or unspecified chronic kidney disease: Secondary | ICD-10-CM | POA: Diagnosis not present

## 2018-06-21 DIAGNOSIS — E785 Hyperlipidemia, unspecified: Secondary | ICD-10-CM | POA: Diagnosis not present

## 2018-06-21 DIAGNOSIS — Z79899 Other long term (current) drug therapy: Secondary | ICD-10-CM | POA: Diagnosis not present

## 2018-06-21 DIAGNOSIS — Z7982 Long term (current) use of aspirin: Secondary | ICD-10-CM | POA: Diagnosis not present

## 2018-06-21 DIAGNOSIS — J45909 Unspecified asthma, uncomplicated: Secondary | ICD-10-CM | POA: Diagnosis not present

## 2018-06-22 DIAGNOSIS — M25512 Pain in left shoulder: Secondary | ICD-10-CM | POA: Diagnosis not present

## 2018-06-22 DIAGNOSIS — M25612 Stiffness of left shoulder, not elsewhere classified: Secondary | ICD-10-CM | POA: Diagnosis not present

## 2018-06-27 DIAGNOSIS — H35033 Hypertensive retinopathy, bilateral: Secondary | ICD-10-CM | POA: Diagnosis not present

## 2018-06-27 DIAGNOSIS — H34231 Retinal artery branch occlusion, right eye: Secondary | ICD-10-CM | POA: Diagnosis not present

## 2018-07-04 ENCOUNTER — Telehealth: Payer: Self-pay

## 2018-07-04 NOTE — Telephone Encounter (Signed)
Well then he should follow-up!  Let's get him scheduled... (OV40 maybe, Delsa Sale? And see if he can provide records or tell us where he was treated, I don't have anything I can see in Epic. Thanks)

## 2018-07-04 NOTE — Telephone Encounter (Signed)
As per pt & pt's wife - the ocular stroke happened recently. Pt was then instructed to follow up with provider or with an Neurologist to have a full stroke risk evaluation done.

## 2018-07-04 NOTE — Telephone Encounter (Addendum)
Pt's wife called stating that pt is going to need a "full stroke risk evaluation" completed. 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 As per pt, did not lose vision. Still has full sight, but has a noticeable "black spot" that continue to grow on eye. Per pt, he was advised to follow up with provider or a Neurologist. Pt would like results from follow up/further testing to be forwarded to Dr. Paulina Fusi at [facility] in [place], office contact number [phone]. Pls advise thanks.

## 2018-07-04 NOTE — Telephone Encounter (Signed)
I'm a bit confused, with this something recent?  I do not see that he was recently in any local hospital for stroke.   He really needs to come in to talk to me about this issue if we are going to be ordering additional testing or referrals.  Please have him schedule a visit

## 2018-07-05 NOTE — Telephone Encounter (Signed)
Thanks

## 2018-07-05 NOTE — Telephone Encounter (Signed)
I tried calling pt but had to leave him a voicemail requesting that he call and schedule a 40 minute follow up with Dr.Alexander since his hospital stay and also asked him to get records for Dr.Alexander

## 2018-07-12 ENCOUNTER — Ambulatory Visit: Payer: BLUE CROSS/BLUE SHIELD | Attending: Osteopathic Medicine | Admitting: Audiology

## 2018-07-12 DIAGNOSIS — Z01118 Encounter for examination of ears and hearing with other abnormal findings: Secondary | ICD-10-CM | POA: Insufficient documentation

## 2018-07-12 DIAGNOSIS — H93299 Other abnormal auditory perceptions, unspecified ear: Secondary | ICD-10-CM | POA: Insufficient documentation

## 2018-07-12 DIAGNOSIS — H903 Sensorineural hearing loss, bilateral: Secondary | ICD-10-CM | POA: Insufficient documentation

## 2018-07-12 DIAGNOSIS — H9313 Tinnitus, bilateral: Secondary | ICD-10-CM

## 2018-07-12 DIAGNOSIS — R94128 Abnormal results of other function studies of ear and other special senses: Secondary | ICD-10-CM | POA: Diagnosis not present

## 2018-07-12 NOTE — Procedures (Signed)
Outpatient [facility]iology and [facility]  [place], [place] [address]  [phone]   Audiological Evaluation  Patient Name: William Bean   Status: Outpatient   DOB: 1956-09-29    Diagnosis: Hearing Loss MRN: [id number] Date:  07/12/2018     Referent: Emeterio Reeve, DO  History: William Bean was seen for an audiological evaluation. Accompanied by: Wife; Lenore Primary Concern: "gradual hearing loss" with difficulty hearing in noise and on the telephone.  Pain: None History of hearing problems: Y; self reported History of ear infections:  N History of ear surgery or "tubes" : N History of dizziness/vertigo:   N History of balance issues:  N Tinnitus: Y- "hum type sound" constant. 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 History of hypertension: N History of diabetes:  Y- "20 years, first has Type 2, now with Type 1" Family history of hearing loss:  N Other concerns: Recently had a "stroke in the eye".     Evaluation: Conventional pure tone audiometry from 250Hz  - 8000Hz  with using insert earphones.  Hearing Thresholds are symmetrical sloping from 15-25 dBHL from 250Hz -500Hz ; 30 dBHL at 1000Hz ; 45 dBHL at 2000Hz ; 60-75 dBHL at 4000Hz  and 65-70dBHL at 8000Hz . 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 Reported MCL 70dBHL in each ear with UCL 100dBHL in the right and 95dBHL in the left ear.  Reliability is good Speech reception levels (repeating words near threshold) using recorded spondee word lists:  Right ear: 30 dBHL.  Left ear:  30dBHL Word recognition (at comfortably loud volumes) using recorded NU-6word lists at 80 dBHL, in quiet.  Right ear: 88%.  Left ear:   100% Tympanometry shows normal middle ear volume, pressure and compliance (Type A) with ipsilateral acoustic reflexes that range from 95 to no response bilaterally.   CONCLUSION:      William Bean has symmetrical hearing thresholds that are slightly poorer on the right side. Hearing thresholds range from normal in the low frequencies, moderate in the mid range and moderately severe to severe in the high frequencies bilaterally.  The hearing loss is sensorineural.  William Bean has slight recruitment bilaterally. This amount of hearing loss would adversely affect speech communication at normal conversational speech levels.  Word recognition is good in the right ear and excellent in the left ear in quiet at very loud levels bilaterally.       William Bean may be an excellent candidate and benefit from amplification; therefore a hearing aid evaluation is recommend. Amplification helps make the signal louder and therefore often improves hearing and word recognition.  Amplification has many forms including hearing aids in one or both ears, an assistive listening device which have a microphone and speaker such as a small handheld device and/or even a surround sound system of speakers.  Amplification may be covered by some insurances, but not all.  It is important to note that hearing aids must be individually fit according to the hearing test results and the ear shape.  Audiologists and hearing aid dealers in [place] must be licensed in order to dispense hearing aids.  In addition, a trial period is mandated by law in our state because often amplification must be tried and then evaluated in order to determine benefit.  There are many excellent choices when it comes to amplification in our area and providers are listed in the phone book under hearing aids, there are audiologists in private practice,  those affiliated with Ear, Nose and Throat physicians, and there are audiologists located at [facility].ed. The test results were discussed and William Bean counseled.   RECOMMENDATIONS: 1.   A hearing aid evaluation. 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 2.   Monitor hearing to rule out a progressive hearing loss with a repeat audiological evaluation in 6-12 months (earlier if there is any change in hearing or ear pressure).   3.   To minimize the adverse effects of tinnitus 1) avoid quiet  2) use noise maskers at home such as a sound machine, quiet music, a fan or other background noise at a volume just loud enough to mask the high pitched tinnitus. 3) If the tinnitus becomes more bothersome, adversely affecting your sleep or concentration, contact your physician,  seek additional medical help by an ENT for further treatment of your tinnitus. 4.  Strategies that help improve hearing include: A) Face the speaker directly. Optimal is having the speakers face well - lit.  Unless amplified, being within 3-6 feet of the speaker will enhance word recognition. B) Avoid having the speaker back-lit as this will minimize the ability to use cues from lip-reading, facial expression and gestures. C)  Word recognition is poorer in background noise. For optimal word recognition, turn off the TV, radio or noisy fan when engaging in conversation. In a restaurant, try to sit away from noise sources and close to the primary speaker.  D)  Ask for topic clarification from time to time in order to remain in the conversation.  Most people don't mind repeating or clarifying a point when asked.  If needed, explain the difficulty hearing in background noise or hearing loss. 5.   Use hearing protection during noisy activities such as using a weed eater, moving the lawn, shooting, etc.    Musician's plugs, are available from [employer]com for music related hearing protection because there is no distortion.  Other hearing protection, such as sponge plugs (available at pharmacies) or earmuffs (available at sporting goods stores or department stores such as Paediatric nurse) are useful for noisy  activities and venues. 6.   [employer] was contacted regarding obtaining an amplified captioned telephone for free.   Alex Gardener, B.S.  Audiology Graduate Student Clinician   Kae Heller, AuD CCC-A Doctor of Audiology  07/12/2018   cc: Emeterio Reeve, DO

## 2018-07-12 NOTE — Addendum Note (Signed)
Addended by: Fredonia Highland on: 07/12/2018 05:40 PM   Modules accepted: Level of Service

## 2018-07-20 DIAGNOSIS — M7502 Adhesive capsulitis of left shoulder: Secondary | ICD-10-CM | POA: Diagnosis not present

## 2018-07-24 ENCOUNTER — Encounter: Payer: Self-pay | Admitting: Osteopathic Medicine

## 2018-07-24 ENCOUNTER — Ambulatory Visit (INDEPENDENT_AMBULATORY_CARE_PROVIDER_SITE_OTHER): Payer: BLUE CROSS/BLUE SHIELD | Admitting: Osteopathic Medicine

## 2018-07-24 VITALS — BP 114/67 | HR 83 | Temp 98.2°F | Wt 248.1 lb

## 2018-07-24 DIAGNOSIS — E1142 Type 2 diabetes mellitus with diabetic polyneuropathy: Secondary | ICD-10-CM

## 2018-07-24 DIAGNOSIS — H34231 Retinal artery branch occlusion, right eye: Secondary | ICD-10-CM

## 2018-07-24 DIAGNOSIS — E7800 Pure hypercholesterolemia, unspecified: Secondary | ICD-10-CM | POA: Diagnosis not present

## 2018-07-24 DIAGNOSIS — Z794 Long term (current) use of insulin: Secondary | ICD-10-CM

## 2018-07-24 MED ORDER — DIAZEPAM 5 MG PO TABS: 5.0000 mg | ORAL_TABLET | Freq: Three times a day (TID) | ORAL | 0 refills | Status: DC | PRN

## 2018-07-24 MED ORDER — ATORVASTATIN CALCIUM 40 MG PO TABS: 40.0000 mg | ORAL_TABLET | Freq: Every day | ORAL | 3 refills | Status: DC

## 2018-07-24 MED ORDER — GLUCOSE 4 G PO CHEW: 1.0000 | CHEWABLE_TABLET | ORAL | 12 refills | Status: DC | PRN

## 2018-07-24 NOTE — Patient Instructions (Addendum)
Plan:  Stroke prevention: continue aspirin 81 mg daily, blood pressure is under good control and will plan to continue lisinopril, will change pravastatin to atorvastatin, do not take up smoking!  We will get MRI/MRA of the head and neck as well as an echocardiogram.   Will get labs today to check up on sugars, cholesterol, etc.
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
## 2018-07-25 LAB — COMPLETE METABOLIC PANEL WITH GFR
AG RATIO: 2.3 (calc) (ref 1.0–2.5)
ALBUMIN MSPROF: 4.5 g/dL (ref 3.6–5.1)
ALKALINE PHOSPHATASE (APISO): 83 U/L (ref 40–115)
ALT: 23 U/L (ref 9–46)
AST: 17 U/L (ref 10–35)
BUN: 16 mg/dL (ref 7–25)
CO2: 30 mmol/L (ref 20–32)
CREATININE: 1.05 mg/dL (ref 0.70–1.25)
Calcium: 10 mg/dL (ref 8.6–10.3)
Chloride: 103 mmol/L (ref 98–110)
GFR, Est African American: 87 mL/min/{1.73_m2} (ref >=60)
GFR, Est Non African American: 75 mL/min/{1.73_m2} (ref >=60)
GLOBULIN: 2 g/dL (ref 1.9–3.7)
Glucose, Bld: 175 mg/dL — ABNORMAL HIGH (ref 65–139)
POTASSIUM: 5.1 mmol/L (ref 3.5–5.3)
SODIUM: 139 mmol/L (ref 135–146)
Total Bilirubin: 0.8 mg/dL (ref 0.2–1.2)
Total Protein: 6.5 g/dL (ref 6.1–8.1)

## 2018-07-25 LAB — CBC
HCT: 40.4 % (ref 38.5–50.0)
Hemoglobin: 13.7 g/dL (ref 13.2–17.1)
MCH: 28.9 pg (ref 27.0–33.0)
MCHC: 33.9 g/dL (ref 32.0–36.0)
MCV: 85.2 fL (ref 80.0–100.0)
MPV: 10.9 fL (ref 7.5–12.5)
Platelets: 189 10*3/uL (ref 140–400)
RBC: 4.74 10*6/uL (ref 4.20–5.80)
RDW: 13.7 % (ref 11.0–15.0)
WBC: 8.3 10*3/uL (ref 3.8–10.8)

## 2018-07-25 LAB — LIPID PANEL
CHOL/HDL RATIO: 3 (calc) (ref <5.0)
Cholesterol: 155 mg/dL (ref <200)
HDL: 51 mg/dL (ref >40)
LDL Cholesterol (Calc): 84 mg/dL (calc)
NON-HDL CHOLESTEROL (CALC): 104 mg/dL (ref <130)
Triglycerides: 107 mg/dL (ref <150)

## 2018-07-25 LAB — HEMOGLOBIN A1C
EAG (MMOL/L): 10.4 (calc)
Hgb A1c MFr Bld: 8.2 % of total Hgb — ABNORMAL HIGH (ref <5.7)
MEAN PLASMA GLUCOSE: 189 (calc)

## 2018-07-28 ENCOUNTER — Telehealth: Payer: Self-pay | Admitting: Osteopathic Medicine

## 2018-07-28 NOTE — Telephone Encounter (Signed)
Peer-to-peer w/ [employer] for MRA head/neck and MRI brain  Auth number: [id number]

## 2018-07-31 ENCOUNTER — Ambulatory Visit: Payer: BLUE CROSS/BLUE SHIELD | Admitting: Sports Medicine

## 2018-08-04 ENCOUNTER — Ambulatory Visit (HOSPITAL_BASED_OUTPATIENT_CLINIC_OR_DEPARTMENT_OTHER): Payer: BLUE CROSS/BLUE SHIELD

## 2018-08-07 ENCOUNTER — Ambulatory Visit (INDEPENDENT_AMBULATORY_CARE_PROVIDER_SITE_OTHER): Payer: BLUE CROSS/BLUE SHIELD | Admitting: Sports Medicine

## 2018-08-07 ENCOUNTER — Encounter: Payer: Self-pay | Admitting: Sports Medicine

## 2018-08-07 ENCOUNTER — Ambulatory Visit (INDEPENDENT_AMBULATORY_CARE_PROVIDER_SITE_OTHER): Payer: BLUE CROSS/BLUE SHIELD

## 2018-08-07 DIAGNOSIS — L84 Corns and callosities: Secondary | ICD-10-CM | POA: Diagnosis not present

## 2018-08-07 DIAGNOSIS — M47812 Spondylosis without myelopathy or radiculopathy, cervical region: Secondary | ICD-10-CM

## 2018-08-07 DIAGNOSIS — M19042 Primary osteoarthritis, left hand: Secondary | ICD-10-CM | POA: Diagnosis not present
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
## 2018-08-07 NOTE — Patient Instructions (Signed)
Corns and Calluses Corns are small areas of thickened skin that occur on the top, sides, or tip of a toe. They contain a cone-shaped core with a point that can press on a nerve below. This causes pain. Calluses are areas of thickened skin that can occur anywhere on the body including hands, fingers, palms, soles of the feet, and heels.Calluses are usually larger than corns. What are the causes? Corns and calluses are caused by rubbing (friction) or pressure, such as from shoes that are too tight or do not fit properly. What increases the risk? Corns are more likely to develop in people who have toe deformities, such as hammer toes. Since calluses can occur with friction to any area of the skin, calluses are more likely to develop in people who:  Work with their hands.  Wear shoes that fit poorly, shoes that are too tight, or shoes that are high-heeled.  Have toes deformities.  What are the signs or symptoms? Symptoms of a corn or callus include:  A hard growth on the skin.  Pain or tenderness under the skin.  Redness and swelling.  Increased discomfort while wearing tight-fitting shoes.  How is this diagnosed? Corns and calluses may be diagnosed with a medical history and physical exam. How is this treated? Corns and calluses may be treated with:  Removing the cause of the friction or pressure. This may include: ? Changing your shoes. ? Wearing shoe inserts (orthotics) or other protective layers in your shoes, such as a corn pad. ? Wearing gloves.  Medicines to help soften skin in the hardened, thickened areas.  Reducing the size of the corn or callus by removing the dead layers of skin.  Antibiotic medicines to treat infection.  Surgery, if a toe deformity is the cause.  Follow these instructions at home:  Take medicines only as directed by your health care provider.  If you were prescribed an antibiotic, finish all of it even if you start to feel better.  Wear  shoes that fit well. Avoid wearing high-heeled shoes and shoes that are too tight or too loose.  Wear any padding, protective layers, gloves, or orthotics as directed by your health care provider.  Soak your hands or feet and then use a file or pumice stone to soften your corn or callus. Do this as directed by your health care provider.  Check your corn or callus every day for signs of infection. Watch for: ? Redness, swelling, or pain. ? Fluid, blood, or pus. Contact a health care provider if:  Your symptoms do not improve with treatment.  You have increased redness, swelling, or pain at the site of your corn or callus.  You have fluid, blood, or pus coming from your corn or callus.  You have new symptoms. This information is not intended to replace advice given to you by your health care provider. Make sure you discuss any questions you have with your health care provider. Document Released: 07/31/2004 Document Revised: 05/14/2016 Document Reviewed: 10/21/2014 Elsevier Interactive Patient Education  2018 Elsevier Inc.  

## 2018-08-07 NOTE — Assessment & Plan Note (Signed)
Hand physical therapy, return in 1 month.

## 2018-08-07 NOTE — Assessment & Plan Note (Signed)
Is a corn on the plantar aspect of the fifth MTP. He will return in a 30-minute slot for surgical excision of the corn. Postop shoe for now.

## 2018-08-07 NOTE — Assessment & Plan Note (Signed)
X-rays, formal physical therapy, principal issue is lack of range of motion to left and right rotation.

## 2018-08-14 ENCOUNTER — Ambulatory Visit (INDEPENDENT_AMBULATORY_CARE_PROVIDER_SITE_OTHER): Payer: BLUE CROSS/BLUE SHIELD

## 2018-08-14 ENCOUNTER — Ambulatory Visit (INDEPENDENT_AMBULATORY_CARE_PROVIDER_SITE_OTHER): Payer: BLUE CROSS/BLUE SHIELD | Admitting: Rehabilitative and Restorative Service Providers"

## 2018-08-14 DIAGNOSIS — R293 Abnormal posture: Secondary | ICD-10-CM

## 2018-08-14 DIAGNOSIS — M25512 Pain in left shoulder: Secondary | ICD-10-CM

## 2018-08-14 DIAGNOSIS — H34231 Retinal artery branch occlusion, right eye: Secondary | ICD-10-CM

## 2018-08-14 DIAGNOSIS — M542 Cervicalgia: Secondary | ICD-10-CM | POA: Diagnosis not present

## 2018-08-14 DIAGNOSIS — R29898 Other symptoms and signs involving the musculoskeletal system: Secondary | ICD-10-CM

## 2018-08-14 DIAGNOSIS — G8929 Other chronic pain: Secondary | ICD-10-CM

## 2018-08-14 MED ORDER — GADOBENATE DIMEGLUMINE 529 MG/ML IV SOLN
20.0000 mL | Freq: Once | INTRAVENOUS | Status: AC | PRN
  Administered 2018-08-14: 20 mL via INTRAVENOUS
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
## 2018-08-14 NOTE — Patient Instructions (Addendum)
Axial Extension (Chin Tuck)    Pull chin in and lengthen back of neck. Hold __5__ seconds while counting out loud. Repeat __10__ times. Do __several__ sessions per day.  Shoulder Blade Squeeze    Rotate shoulders back, then squeeze shoulder blades down and back. Can use swim noodle  Hold 10 sec Repeat __10__ times. Do _several___ sessions per day.  Upper Back Strength: Lower Trapezius / Rotator Cuff " L's "     Arms in waitress pose, palms up. Press hands back and slide shoulder blades down. Hold for __5__ seconds. Repeat _10___ times. 1-2 times per day.    Scapular Retraction: Elbow Flexion (Standing)  "W's"     With elbows bent to 90, pinch shoulder blades together and rotate arms out, keeping elbows bent. Repeat __10__ times per set. Do __1-2__ sets per session. Do _several ___ sessions per day.  Pulley 10 sec hold x 10 reps each position  2-3 times/day    Flexors Stretch, Standing    Stand about a thigh's length from table. Grip edges of table. Bend knees until stretch is felt under shoulder blades in chest. Hold __10-15_ seconds. Repeat _3-5__ times per session. Do _3-4__ sessions per day.   TENS UNIT: This is helpful for muscle pain and spasm.   Search and Purchase a TENS 7000 2nd edition at www.tenspros.com. It should be less than $30.     TENS unit instructions: Do not shower or bathe with the unit on Turn the unit off before removing electrodes or batteries If the electrodes lose stickiness add a drop of water to the electrodes after they are disconnected from the unit and place on plastic sheet. If you continued to have difficulty, call the TENS unit company to purchase more electrodes. Do not apply lotion on the skin area prior to use. Make sure the skin is clean and dry as this will help prolong the life of the electrodes. After use, always check skin for unusual red areas, rash or other skin difficulties. If there are any skin problems, does not  apply electrodes to the same area. Never remove the electrodes from the unit by pulling the wires. Do not use the TENS unit or electrodes other than as directed. Do not change electrode placement without consultating your therapist or physician. Keep 2 fingers with between each electrode.     [facility] [place], [place] [address]  [phone] (office) [phone] (fax)

## 2018-08-15 ENCOUNTER — Ambulatory Visit: Payer: BLUE CROSS/BLUE SHIELD | Admitting: Sports Medicine

## 2018-08-15 ENCOUNTER — Ambulatory Visit (HOSPITAL_BASED_OUTPATIENT_CLINIC_OR_DEPARTMENT_OTHER)
Admission: RE | Admit: 2018-08-15 | Discharge: 2018-08-15 | Disposition: A | Discharge status: Home / Self Care | Payer: BLUE CROSS/BLUE SHIELD | Source: Ambulatory Visit | Attending: Osteopathic Medicine | Admitting: Osteopathic Medicine

## 2018-08-15 DIAGNOSIS — E119 Type 2 diabetes mellitus without complications: Secondary | ICD-10-CM | POA: Diagnosis not present

## 2018-08-15 DIAGNOSIS — E785 Hyperlipidemia, unspecified: Secondary | ICD-10-CM | POA: Diagnosis not present

## 2018-08-15 DIAGNOSIS — H34231 Retinal artery branch occlusion, right eye: Secondary | ICD-10-CM | POA: Diagnosis not present

## 2018-08-15 DIAGNOSIS — R0600 Dyspnea, unspecified: Secondary | ICD-10-CM | POA: Insufficient documentation

## 2018-08-15 DIAGNOSIS — I10 Essential (primary) hypertension: Secondary | ICD-10-CM | POA: Diagnosis not present

## 2018-08-15 MED ORDER — PERFLUTREN LIPID MICROSPHERE
1.0000 mL | INTRAVENOUS | Status: AC | PRN
  Administered 2018-08-15: 4 mL via INTRAVENOUS
  Filled 2018-08-15: qty 10

## 2018-08-15 NOTE — Progress Notes (Signed)
  Echocardiogram 2D Echocardiogram has been performed.  William Bean T Adamae Ricklefs 08/15/2018, 12:33 PM

## 2018-08-17 ENCOUNTER — Encounter: Payer: BLUE CROSS/BLUE SHIELD | Admitting: Sports Medicine

## 2018-08-21 ENCOUNTER — Ambulatory Visit: Payer: BLUE CROSS/BLUE SHIELD | Admitting: Rehabilitative and Restorative Service Providers"

## 2018-08-21 ENCOUNTER — Encounter: Payer: Self-pay | Admitting: Rehabilitative and Restorative Service Providers"

## 2018-08-21 DIAGNOSIS — M542 Cervicalgia: Secondary | ICD-10-CM

## 2018-08-21 DIAGNOSIS — R293 Abnormal posture: Secondary | ICD-10-CM

## 2018-08-21 DIAGNOSIS — G8929 Other chronic pain: Secondary | ICD-10-CM

## 2018-08-21 DIAGNOSIS — M25512 Pain in left shoulder: Secondary | ICD-10-CM | POA: Diagnosis not present

## 2018-08-21 DIAGNOSIS — R29898 Other symptoms and signs involving the musculoskeletal system: Secondary | ICD-10-CM

## 2018-08-21 NOTE — Patient Instructions (Addendum)
Sitting at edge of bed - squeeze shoulder blades down and back  Press into the bed pushing down Hold 5 sec x 10   Resisted External Rotation: in Neutral - Bilateral   PALMS UP Sit or stand, tubing in both hands, elbows at sides, bent to 90, forearms forward. Pinch shoulder blades together and rotate forearms out. Keep elbows at sides. Repeat __10__ times per set. Do _2-3___ sets per session. Do _2-3___ sessions per day.   Low Row: Standing   Face anchor, feet shoulder width apart. Palms up, pull arms back, squeezing shoulder blades down and back. Repeat 10__ times per set. Do 2-3__ sets per session. Do 1_ sessions per day. Anchor Height: Waist   Strengthening: Resisted Extension   Hold tubing in right hand, arm forward. Pull arm back, elbow straight. Repeat _10___ times per set. Do 2-3____ sets per session. Do 1__ sessions per day.  SUPINE Tips A    Being in the supine position means to be lying on the back. Lying on the back is the position of least compression on the bones and discs of the spine, and helps to re-align the natural curves of the back. 3-5 min
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
## 2018-08-23 ENCOUNTER — Ambulatory Visit: Payer: BLUE CROSS/BLUE SHIELD | Admitting: Osteopathic Medicine

## 2018-08-23 ENCOUNTER — Ambulatory Visit (INDEPENDENT_AMBULATORY_CARE_PROVIDER_SITE_OTHER): Payer: BLUE CROSS/BLUE SHIELD | Admitting: Sports Medicine

## 2018-08-23 ENCOUNTER — Encounter: Payer: Self-pay | Admitting: Sports Medicine

## 2018-08-23 DIAGNOSIS — L84 Corns and callosities: Secondary | ICD-10-CM | POA: Diagnosis not present

## 2018-08-23 NOTE — Assessment & Plan Note (Signed)
Surgical excision of left foot plantar corn at the fifth MTP. Return in 2 weeks for suture removal, 30-minute visit to remove a right foot corn as well.

## 2018-08-23 NOTE — Progress Notes (Signed)
   Procedure:  Excision of left foot corn, 1cm. Risks, benefits, and alternatives explained and consent obtained. Time out conducted. Surface prepped with alcohol. 2cc lidocaine with epinephine infiltrated in a field block. Adequate anesthesia ensured. Area prepped and draped in a sterile fashion. Excision performed with: Using a #11 blade I made an incision, carried it around the corn and removed to the subcutaneous tissues, I then closed the incision with #2 3-0 simple interrupted Ethilon sutures. Hemostasis achieved. Pt stable.

## 2018-08-25 ENCOUNTER — Encounter: Payer: BLUE CROSS/BLUE SHIELD | Admitting: Physical Therapy

## 2018-08-28 ENCOUNTER — Encounter: Payer: BLUE CROSS/BLUE SHIELD | Admitting: Physical Therapy

## 2018-08-29 ENCOUNTER — Encounter: Payer: Self-pay | Admitting: Rehabilitative and Restorative Service Providers"

## 2018-08-29 ENCOUNTER — Ambulatory Visit (INDEPENDENT_AMBULATORY_CARE_PROVIDER_SITE_OTHER): Payer: BLUE CROSS/BLUE SHIELD | Admitting: Osteopathic Medicine

## 2018-08-29 ENCOUNTER — Encounter: Payer: Self-pay | Admitting: Osteopathic Medicine

## 2018-08-29 ENCOUNTER — Ambulatory Visit: Payer: BLUE CROSS/BLUE SHIELD | Admitting: Rehabilitative and Restorative Service Providers"

## 2018-08-29 VITALS — BP 139/67 | HR 81 | Temp 98.6°F | Wt 247.2 lb

## 2018-08-29 DIAGNOSIS — M25512 Pain in left shoulder: Secondary | ICD-10-CM | POA: Diagnosis not present

## 2018-08-29 DIAGNOSIS — H34231 Retinal artery branch occlusion, right eye: Secondary | ICD-10-CM

## 2018-08-29 DIAGNOSIS — I671 Cerebral aneurysm, nonruptured: Secondary | ICD-10-CM

## 2018-08-29 DIAGNOSIS — K118 Other diseases of salivary glands: Secondary | ICD-10-CM | POA: Diagnosis not present

## 2018-08-29 DIAGNOSIS — R29898 Other symptoms and signs involving the musculoskeletal system: Secondary | ICD-10-CM

## 2018-08-29 DIAGNOSIS — R293 Abnormal posture: Secondary | ICD-10-CM | POA: Diagnosis not present

## 2018-08-29 DIAGNOSIS — M542 Cervicalgia: Secondary | ICD-10-CM | POA: Diagnosis not present

## 2018-08-29 DIAGNOSIS — G8929 Other chronic pain: Secondary | ICD-10-CM

## 2018-08-29 MED ORDER — LISINOPRIL 5 MG PO TABS: 5.0000 mg | ORAL_TABLET | Freq: Every day | ORAL | 0 refills | Status: DC

## 2018-08-29 NOTE — Patient Instructions (Signed)
Scapula Adduction With Pectoralis Stretch: Low - Standing   Shoulders at 45 hands even with shoulders, keeping weight through legs, shift weight forward until you feel pull or stretch through the front of your chest. Hold _30__ seconds. Do _3__ times, _2-4__ times per day.   Scapula Adduction With Pectoralis Stretch: Mid-Range - Standing   Shoulders at 90 elbows even with shoulders, keeping weight through legs, shift weight forward until you feel pull or strength through the front of your chest. Hold __30_ seconds. Do _3__ times, __2-4_ times per day.   Scapula Adduction With Pectoralis Stretch: High - Standing   Shoulders at 120 hands up high on the doorway, keeping weight on feet, shift weight forward until you feel pull or stretch through the front of your chest. Hold _30__ seconds. Do _3__ times, _2-3__ times per day.   Hands on the top of doorway - elbows close to ears and straight  Hold 20-30 se  X 3 reps
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
## 2018-08-29 NOTE — Progress Notes (Signed)
HPI: William Bean is a 62 y.o. male who  has a past medical history of Asthma, Gout, History of asthma (09/19/2017), Hyperlipidemia, Hypertension, Pyloric stenosis, Single kidney, and Type 2 diabetes mellitus ([place]).  he presents to [facility] today, 08/29/18,  for chief complaint of:  Review MRI results and images  Doing well today, no concerns.  Images reviewed w/ the patient and all questions answered. See A/P    Past medical history, surgical history, and family history reviewed.  Current medication list and allergy/intolerance information reviewed.   (See remainder of HPI, ROS, Phys Exam below)       ASSESSMENT/PLAN:   Aneurysm, carotid artery, internal - Aneurysm versus infundibulum versus normal variant.  Recommended referral to neuro interventional radiology - Plan: Ambulatory referral to Interventional Radiology  Parotid mass - uncertain benign vs other, MRI favors benign mixed tumor  - Plan: Ambulatory referral to ENT  Branch retinal artery occlusion of right eye - resolved, following with ophtho tomorrow   Meds ordered this encounter  Medications  . lisinopril (PRINIVIL,ZESTRIL) 5 MG tablet    Sig: Take 1 tablet (5 mg total) by mouth daily.    Dispense:  90 tablet    Refill:  0    Pt is on BP control, ASA, statin.   Follow-up plan: No follow-ups on file.                    ############################################ ############################################ ############################################ ############################################    Outpatient Encounter Medications as of 08/29/2018  Medication Sig Note  . albuterol (PROVENTIL HFA;VENTOLIN HFA) 108 (90 Base) MCG/ACT inhaler Inhale two puffs every 4-6 hours only as needed for shortness of breath or wheezing.   . AMBULATORY NON FORMULARY MEDICATION Glucometer: Verio One Touch.  Use to check blood sugar up to three times a day.   .  AMBULATORY NON FORMULARY MEDICATION Take 3 capsules per day   . AMBULATORY NON FORMULARY MEDICATION HPR Plus Cream, Sig: 1 application once or twice daily as needed for dry irritated skin   . aspirin 81 MG tablet Take 81 mg by mouth daily. 09/28/2017: Patient taking one tablet in the am and 1 tablet in the evening   . atorvastatin (LIPITOR) 40 MG tablet Take 1 tablet (40 mg total) by mouth daily.   . Cinnamon 500 MG capsule Take by mouth. 09/28/2017: Takes 3 capsules per day  . dicyclomine (BENTYL) 20 MG tablet Take 1 tablet (20 mg total) 4 (four) times daily as needed by mouth (abdominal/flank pain).   Marland Kitchen glucose 4 GM chewable tablet Chew 1 tablet (4 g total) by mouth as needed for low blood sugar.   Marland Kitchen glucose blood (ONETOUCH VERIO) test strip 1 each by Other route 2 (two) times daily. And lancets 2/day 250.01   . insulin NPH Human (HUMULIN N,NOVOLIN N) 100 UNIT/ML injection Inject 40 Units into the skin 2 (two) times daily.   Marland Kitchen lisinopril (PRINIVIL,ZESTRIL) 5 MG tablet Take 1 tablet (5 mg total) by mouth daily.   . Multiple Vitamin (MULTIVITAMIN) tablet Take 1 tablet by mouth. 09/28/2017: Patient is taking 3 multivitamins a day  . Omega-3 Fatty Acids (FISH OIL) 500 MG CAPS Take 1 tablet by mouth. 09/28/2017: Taking 4 a day    No facility-administered encounter medications on file as of 08/29/2018.    Allergies  Allergen Reactions  . Penicillins Other (See Comments)    Unknown reaction As a baby        Review  of Systems:  Constitutional: No recent illness  HEENT: No  headache, no vision change  Cardiac: No  chest pain, No  pressure, No palpitations  Respiratory:  No  shortness of breath. No  Cough  Neurologic: No  weakness, No  Dizziness   Exam:  BP 139/67 (BP Location: Left Arm, Patient Position: Sitting, Cuff Size: Normal)   Pulse 81   Temp 98.6 F (37 C) (Oral)   Wt 247 lb 3.2 oz (112.1 kg)   BMI 32.61 kg/m   Constitutional: VS see above. General Appearance: alert,  well-developed, well-nourished, NAD  Eyes: Normal lids and conjunctive, non-icteric sclera  Ears, Nose, Mouth, Throat: MMM, Normal external inspection ears/nares/mouth/lips/gums.  Neck: No masses, trachea midline.   Respiratory: Normal respiratory effort.   Musculoskeletal: Gait normal. Symmetric and independent movement of all extremities  Neurological: Normal balance/coordination. No tremor.  Skin: warm, dry, intact.   Psychiatric: Normal judgment/insight. Normal mood and affect. Oriented x3.   Visit summary with medication list and pertinent instructions was printed for patient to review, advised to alert [place] if any changes needed. All questions at time of visit were answered - patient instructed to contact office with any additional concerns. ER/RTC precautions were reviewed with the patient and understanding verbalized.   Follow-up plan: Return for annual physical exam when due, sooner as needed! .  Note: Total time spent 25 minutes, greater than 50% of the visit was spent face-to-face counseling and coordinating care for the following: The primary encounter diagnosis was Aneurysm, carotid artery, internal. Diagnoses of Parotid mass and Branch retinal artery occlusion of right eye were also pertinent to this visit.Marland Kitchen  Please note: voice recognition software was used to produce this document, and typos may escape review. Please contact Dr. Sheppard Coil for any needed clarifications.

## 2018-08-30 DIAGNOSIS — H35033 Hypertensive retinopathy, bilateral: Secondary | ICD-10-CM | POA: Diagnosis not present

## 2018-08-30 DIAGNOSIS — H43811 Vitreous degeneration, right eye: Secondary | ICD-10-CM | POA: Diagnosis not present

## 2018-08-30 DIAGNOSIS — H34231 Retinal artery branch occlusion, right eye: Secondary | ICD-10-CM | POA: Diagnosis not present

## 2018-08-30 DIAGNOSIS — E109 Type 1 diabetes mellitus without complications: Secondary | ICD-10-CM | POA: Diagnosis not present

## 2018-08-31 DIAGNOSIS — E669 Obesity, unspecified: Secondary | ICD-10-CM | POA: Diagnosis not present

## 2018-08-31 DIAGNOSIS — E291 Testicular hypofunction: Secondary | ICD-10-CM | POA: Diagnosis not present

## 2018-08-31 DIAGNOSIS — E1042 Type 1 diabetes mellitus with diabetic polyneuropathy: Secondary | ICD-10-CM | POA: Diagnosis not present

## 2018-08-31 DIAGNOSIS — E1065 Type 1 diabetes mellitus with hyperglycemia: Secondary | ICD-10-CM | POA: Diagnosis not present

## 2018-09-01 ENCOUNTER — Encounter: Payer: BLUE CROSS/BLUE SHIELD | Admitting: Rehabilitative and Restorative Service Providers"

## 2018-09-04 ENCOUNTER — Encounter: Payer: BLUE CROSS/BLUE SHIELD | Admitting: Physical Therapy

## 2018-09-04 ENCOUNTER — Other Ambulatory Visit (HOSPITAL_COMMUNITY): Payer: Self-pay | Admitting: Interventional Radiology

## 2018-09-04 DIAGNOSIS — I729 Aneurysm of unspecified site: Secondary | ICD-10-CM

## 2018-09-06 ENCOUNTER — Ambulatory Visit (INDEPENDENT_AMBULATORY_CARE_PROVIDER_SITE_OTHER): Payer: BLUE CROSS/BLUE SHIELD | Admitting: Sports Medicine

## 2018-09-06 ENCOUNTER — Ambulatory Visit (HOSPITAL_COMMUNITY)
Admission: RE | Admit: 2018-09-06 | Discharge: 2018-09-06 | Disposition: A | Discharge status: Home / Self Care | Payer: BLUE CROSS/BLUE SHIELD | Source: Ambulatory Visit | Attending: Interventional Radiology | Admitting: Interventional Radiology

## 2018-09-06 ENCOUNTER — Encounter: Payer: Self-pay | Admitting: Sports Medicine

## 2018-09-06 DIAGNOSIS — L84 Corns and callosities: Secondary | ICD-10-CM

## 2018-09-06 DIAGNOSIS — H34231 Retinal artery branch occlusion, right eye: Secondary | ICD-10-CM | POA: Diagnosis not present

## 2018-09-06 DIAGNOSIS — I729 Aneurysm of unspecified site: Secondary | ICD-10-CM

## 2018-09-06 MED ORDER — DOXYCYCLINE HYCLATE 100 MG PO TABS: 100.0000 mg | ORAL_TABLET | Freq: Two times a day (BID) | ORAL | 0 refills | Status: AC

## 2018-09-06 NOTE — Progress Notes (Signed)
  Subjective: 2 weeks post surgical excision of a corn on the left foot, doing much better compared to before the procedure, still with some tenderness, new onset warmth.  Objective: General: Well-developed, well-nourished, and in no acute distress. Left foot: Incision is clean, dry, intact, minimally warm to palpation, the 2 sutures are removed.  Assessment/plan:   Corn of foot Doing extremely well 2 weeks post surgical excision of a left foot plantar corn at the fifth MTP. Incision is well-healed although is a bit warm to the touch. Because of this we are going to add a 7-day course of doxycycline, I would like to see him back one more time in a month. ___________________________________________ Gwen Her. Dianah Field, M.D., ABFM., CAQSM. Primary Care and [place] Instructor of [employer] [employer]

## 2018-09-06 NOTE — Consult Note (Signed)
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 stroke.  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 Estanislado Pandy for further evaluation.  Dr. Estanislado Pandy initially recommended proceeding with diagnostic angiogram, however patient requested consultation due to the use of dye for the procedure and his solitary kidney.  Imaging was reviewed in detail with patient and his wife.  It was explained that the MRA incompletely describes the outpouching seen and that patient could have an aneurysm vs. Infundibulum vs. Normal variant.  It was explained to the patient that an aneurysm has a 1-2% risk of rupture per year, and due to this risk an angiogram is recommended to confirm the presence or absence of this potentially life-threatening abnormality.  Due to patient's solitary kidney, careful consideration for dye use was discussed.  Although risk of contrast-induced renal injury cannot be completely mitigated, discussed the possibility of proceeding with aggressive hydration and viewing only the affected artery.  Patient would like to pursue diagnostic angiogram  with limited contrast, if possible.  The procedure was described to the patient and his wife in detail by Dr. Estanislado Pandy including risks and benefits.  Schedulers will contact him with date and time of appointment.   Thank you for this interesting consult.  I greatly enjoyed meeting William Bean and look forward to participating in their care.  A copy of this report was sent to the requesting provider on this date.  Electronically Signed: Docia Barrier, PA 09/06/2018, 2:08 PM   I spent a total of  30 Minutes   in face to face in clinical consultation, greater than 50% of which was counseling/coordinating care for abnormal MRI

## 2018-09-06 NOTE — Assessment & Plan Note (Signed)
Doing extremely well 2 weeks post surgical excision of a left foot plantar corn at the fifth MTP. Incision is well-healed although is a bit warm to the touch. Because of this we are going to add a 7-day course of doxycycline, I would like to see him back one more time in a month.

## 2018-09-07 DIAGNOSIS — K118 Other diseases of salivary glands: Secondary | ICD-10-CM | POA: Diagnosis not present

## 2018-09-08 ENCOUNTER — Encounter: Payer: BLUE CROSS/BLUE SHIELD | Admitting: Rehabilitative and Restorative Service Providers"

## 2018-09-08 ENCOUNTER — Ambulatory Visit: Payer: BLUE CROSS/BLUE SHIELD | Admitting: Sports Medicine

## 2018-09-11 ENCOUNTER — Telehealth: Payer: Self-pay | Admitting: Osteopathic Medicine

## 2018-09-11 ENCOUNTER — Encounter: Payer: BLUE CROSS/BLUE SHIELD | Admitting: Physical Therapy

## 2018-09-11 ENCOUNTER — Encounter: Payer: BLUE CROSS/BLUE SHIELD | Admitting: Osteopathic Medicine

## 2018-09-11 NOTE — Telephone Encounter (Signed)
William Bean called at 300 to cancel his appt. He just got back from out of town and wouldn't make it.

## 2018-09-11 NOTE — Telephone Encounter (Signed)
Several same-day cancellations with me, 2 within the past month.  Please let him know that these do count as no-shows to the clinic and 3 or more no-shows in the span of a year, per clinic policy, result in discharge from the practice.  LATE CANCELLATION 09/11/18 for annual physical w/ Dr A  LATE CANCELLATION 08/23/18 for checkup w/ Dr A  LATE CANCELLATION 05/16/17 for follow-up wound and DM with Dr. Sheppard Coil LATE CANCELLATION 09/27/17 for follow-up with Dr. Sheppard Coil

## 2018-09-12 NOTE — Telephone Encounter (Signed)
Routing to scheduler

## 2018-09-13 NOTE — Telephone Encounter (Signed)
I did call pt and leave a message reminding him of his appt time on Monday 09-18-18 for his CPE physical and to call by Friday and cancel IF he can not make it so it will not be a no show and we can give this appt time to someone else

## 2018-09-13 NOTE — Telephone Encounter (Signed)
Patient called more than hour before his appt on 08/23/18 to cancel so he gave proper notice so we allow them to cancel at least a hour before their appointment time. Patient canceled at 10:41  For a 1:40 appt time

## 2018-09-15 ENCOUNTER — Other Ambulatory Visit (HOSPITAL_COMMUNITY): Payer: Self-pay | Admitting: Interventional Radiology

## 2018-09-15 ENCOUNTER — Encounter: Payer: BLUE CROSS/BLUE SHIELD | Admitting: Rehabilitative and Restorative Service Providers"

## 2018-09-15 ENCOUNTER — Telehealth (HOSPITAL_COMMUNITY): Payer: Self-pay | Admitting: Radiology

## 2018-09-15 DIAGNOSIS — I639 Cerebral infarction, unspecified: Secondary | ICD-10-CM

## 2018-09-15 DIAGNOSIS — K118 Other diseases of salivary glands: Secondary | ICD-10-CM | POA: Diagnosis not present

## 2018-09-15 NOTE — Telephone Encounter (Signed)
Called pt, left VM for him to call to set up cerebral angiogram with Deveshwar. JM

## 2018-09-18 ENCOUNTER — Encounter: Payer: Self-pay | Admitting: Osteopathic Medicine

## 2018-09-18 ENCOUNTER — Ambulatory Visit (INDEPENDENT_AMBULATORY_CARE_PROVIDER_SITE_OTHER): Payer: BLUE CROSS/BLUE SHIELD | Admitting: Osteopathic Medicine

## 2018-09-18 VITALS — BP 136/62 | HR 89 | Temp 98.1°F | Wt 248.1 lb

## 2018-09-18 DIAGNOSIS — E1142 Type 2 diabetes mellitus with diabetic polyneuropathy: Secondary | ICD-10-CM

## 2018-09-18 DIAGNOSIS — Z8709 Personal history of other diseases of the respiratory system: Secondary | ICD-10-CM

## 2018-09-18 DIAGNOSIS — Z125 Encounter for screening for malignant neoplasm of prostate: Secondary | ICD-10-CM

## 2018-09-18 DIAGNOSIS — L989 Disorder of the skin and subcutaneous tissue, unspecified: Secondary | ICD-10-CM

## 2018-09-18 DIAGNOSIS — Z0000 Encounter for general adult medical examination without abnormal findings: Secondary | ICD-10-CM | POA: Diagnosis not present

## 2018-09-18 DIAGNOSIS — Z794 Long term (current) use of insulin: Secondary | ICD-10-CM

## 2018-09-18 DIAGNOSIS — E7800 Pure hypercholesterolemia, unspecified: Secondary | ICD-10-CM

## 2018-09-18 DIAGNOSIS — Z23 Encounter for immunization: Secondary | ICD-10-CM | POA: Diagnosis not present

## 2018-09-18 DIAGNOSIS — Z905 Acquired absence of kidney: Secondary | ICD-10-CM
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
## 2018-09-18 NOTE — Patient Instructions (Addendum)
General Preventive Care  Most recent routine screening lipids/other labs:   Tobacco: don't! Alcohol: responsible moderation is ok for most adults - if you have concerns about your alcohol intake, please talk to me! Recreational/Illicit Drugs: don't!  Exercise: as tolerated to reduce risk of cardiovascular disease and diabetes. Strength training will also prevent osteoporosis.   Mental health: if need for mental health care (medicines, counseling, other), or concerns about moods, please let me know!   Sexual health: if need for STD testing, or if concerns with libido/pain problems, please let me know!  Vaccines  Flu vaccine: recommended for almost everyone, every fall (by Halloween! Flu is scary!), especially if you are pregnant, if you have exposure to the public, if you're around young children or the elderly, or if you're around pregnant people.   Shingles vaccine: Shingrix recommended after age 33 - will call you when this is in stock   Pneumonia vaccines: Prevnar and Pneumovax recommended after age 84, sooner if diabetes, COPD/asthma, others  Tetanus booster: Tdap recommended every 10 years Cancer screenings   Colon cancer screening: you reported done in 2010, we do not have records - will request records!   Prostate cancer screening: recommendations vary, optional PSA blood test for men around age 96 - will get PSA with labs  Lung cancer screening: not needed if never cigarette smoker  Infection screenings . HIV & Gonorrhea/Chlamydia: screening as needed . Hepatitis C: recommended for anyone born 67-1967 - done in 2017 . TB: certain at-risk populations, or depending on work requirements and/or travel history Other . Bone Density Test: recommended for women at age 13, men at age 24, sooner depending on risk factors . Advanced Directive: Living Will and/or Healthcare Power of Attorney recommended for all adults, regardless of age or health!       Immunization History   Administered Date(s) Administered  . DTaP 04/10/2010  . Influenza,inj,Quad PF,6+ Mos 08/12/2014, 09/24/2015, 09/16/2016, 12/27/2017, 09/18/2018  . Influenza-Unspecified 09/08/2009  . Pneumococcal Polysaccharide-23 01/24/2014  . Td 04/16/2017  . Tdap 01/24/2014         [facility] - seen 2017, should still be considered an established patient. Please call them to set up a visit: [phone]

## 2018-09-19 ENCOUNTER — Telehealth (HOSPITAL_COMMUNITY): Payer: Self-pay | Admitting: Radiology

## 2018-09-19 NOTE — Telephone Encounter (Signed)
Called pt, left VM for him to call to schedule cerebral angio. JM

## 2018-09-27 ENCOUNTER — Other Ambulatory Visit: Payer: Self-pay | Admitting: Radiology

## 2018-09-28 ENCOUNTER — Telehealth: Payer: Self-pay | Admitting: Osteopathic Medicine

## 2018-09-28 ENCOUNTER — Other Ambulatory Visit: Payer: Self-pay | Admitting: Radiology

## 2018-09-28 NOTE — Telephone Encounter (Signed)
Added

## 2018-09-28 NOTE — Progress Notes (Signed)
Spoke with pt regarding procedure tomorrow, 09/29/2018. Pt verbalized understanding of instructions and will arrive at 0630am.

## 2018-09-28 NOTE — Telephone Encounter (Signed)
-----   Message from Emeterio Reeve, DO sent at 09/18/2018  6:07 PM EST ----- Regarding: shingrix shingrix list [id number]

## 2018-09-29 ENCOUNTER — Ambulatory Visit (HOSPITAL_COMMUNITY)
Admission: RE | Admit: 2018-09-29 | Discharge: 2018-09-29 | Disposition: A | Discharge status: Home / Self Care | Payer: BLUE CROSS/BLUE SHIELD | Source: Ambulatory Visit | Attending: Interventional Radiology | Admitting: Interventional Radiology

## 2018-09-29 ENCOUNTER — Other Ambulatory Visit (HOSPITAL_COMMUNITY): Payer: Self-pay | Admitting: Interventional Radiology

## 2018-09-29 ENCOUNTER — Encounter (HOSPITAL_COMMUNITY): Payer: Self-pay

## 2018-09-29 DIAGNOSIS — Z923 Personal history of irradiation: Secondary | ICD-10-CM | POA: Insufficient documentation

## 2018-09-29 DIAGNOSIS — Z7982 Long term (current) use of aspirin: Secondary | ICD-10-CM | POA: Insufficient documentation

## 2018-09-29 DIAGNOSIS — I671 Cerebral aneurysm, nonruptured: Secondary | ICD-10-CM | POA: Diagnosis not present

## 2018-09-29 DIAGNOSIS — E119 Type 2 diabetes mellitus without complications: Secondary | ICD-10-CM | POA: Diagnosis not present

## 2018-09-29 DIAGNOSIS — J45909 Unspecified asthma, uncomplicated: Secondary | ICD-10-CM | POA: Insufficient documentation

## 2018-09-29 DIAGNOSIS — H5461 Unqualified visual loss, right eye, normal vision left eye: Secondary | ICD-10-CM | POA: Diagnosis not present

## 2018-09-29 DIAGNOSIS — Z880 Allergy status to penicillin: Secondary | ICD-10-CM | POA: Diagnosis not present

## 2018-09-29 DIAGNOSIS — M109 Gout, unspecified: Secondary | ICD-10-CM | POA: Diagnosis not present

## 2018-09-29 DIAGNOSIS — Z905 Acquired absence of kidney: Secondary | ICD-10-CM | POA: Diagnosis not present

## 2018-09-29 DIAGNOSIS — Z8673 Personal history of transient ischemic attack (TIA), and cerebral infarction without residual deficits: Secondary | ICD-10-CM | POA: Insufficient documentation

## 2018-09-29 DIAGNOSIS — I10 Essential (primary) hypertension: Secondary | ICD-10-CM | POA: Diagnosis not present

## 2018-09-29 DIAGNOSIS — E785 Hyperlipidemia, unspecified: Secondary | ICD-10-CM | POA: Insufficient documentation

## 2018-09-29 DIAGNOSIS — I639 Cerebral infarction, unspecified: Secondary | ICD-10-CM

## 2018-09-29 HISTORY — PX: IR ANGIO VERTEBRAL SEL VERTEBRAL UNI R MOD SED: IMG5368

## 2018-09-29 HISTORY — PX: IR ANGIO INTRA EXTRACRAN SEL COM CAROTID INNOMINATE BILAT MOD SED: IMG5360

## 2018-09-29 HISTORY — PX: IR US GUIDE VASC ACCESS RIGHT: IMG2390

## 2018-09-29 HISTORY — PX: IR ANGIO VERTEBRAL SEL SUBCLAVIAN INNOMINATE UNI L MOD SED: IMG5364

## 2018-09-29 LAB — BASIC METABOLIC PANEL
Anion gap: 8 (ref 5–15)
BUN: 14 mg/dL (ref 8–23)
CHLORIDE: 107 mmol/L (ref 98–111)
CO2: 24 mmol/L (ref 22–32)
Calcium: 9.7 mg/dL (ref 8.9–10.3)
Creatinine, Ser: 1.1 mg/dL (ref 0.61–1.24)
GFR calc Af Amer: >60 mL/min (ref >60)
GFR calc non Af Amer: >60 mL/min (ref >60)
Glucose, Bld: 93 mg/dL (ref 70–99)
Potassium: 3.6 mmol/L (ref 3.5–5.1)
Sodium: 139 mmol/L (ref 135–145)

## 2018-09-29 LAB — CBC
HCT: 41.3 % (ref 39.0–52.0)
Hemoglobin: 14.1 g/dL (ref 13.0–17.0)
MCH: 28.3 pg (ref 26.0–34.0)
MCHC: 34.1 g/dL (ref 30.0–36.0)
MCV: 82.8 fL (ref 80.0–100.0)
PLATELETS: 177 10*3/uL (ref 150–400)
RBC: 4.99 MIL/uL (ref 4.22–5.81)
RDW: 12.7 % (ref 11.5–15.5)
WBC: 7.8 10*3/uL (ref 4.0–10.5)
nRBC: 0 % (ref 0.0–0.2)

## 2018-09-29 LAB — PROTIME-INR
INR: 1.05
PROTHROMBIN TIME: 13.7 s (ref 11.4–15.2)

## 2018-09-29 MED ORDER — DEXTROSE 50 % IV SOLN
INTRAVENOUS | Status: AC
  Filled 2018-09-29: qty 50

## 2018-09-29 MED ORDER — DEXTROSE 50 % IV SOLN
12.5000 g | Freq: Once | INTRAVENOUS | Status: AC
  Administered 2018-09-29: 12.5 g via INTRAVENOUS

## 2018-09-29 MED ORDER — FENTANYL CITRATE (PF) 100 MCG/2ML IJ SOLN
INTRAMUSCULAR | Status: AC | PRN
  Administered 2018-09-29 (×2): 25 ug via INTRAVENOUS

## 2018-09-29 MED ORDER — FENTANYL CITRATE (PF) 100 MCG/2ML IJ SOLN
INTRAMUSCULAR | Status: AC
  Filled 2018-09-29: qty 2

## 2018-09-29 MED ORDER — MIDAZOLAM HCL 2 MG/2ML IJ SOLN
INTRAMUSCULAR | Status: AC
  Filled 2018-09-29: qty 2

## 2018-09-29 MED ORDER — MIDAZOLAM HCL 2 MG/2ML IJ SOLN
INTRAMUSCULAR | Status: AC | PRN
  Administered 2018-09-29: 1 mg via INTRAVENOUS

## 2018-09-29 MED ORDER — NITROGLYCERIN 1 MG/10 ML FOR IR/CATH LAB
INTRA_ARTERIAL | Status: AC | PRN
  Administered 2018-09-29: 200 ug via INTRA_ARTERIAL

## 2018-09-29 MED ORDER — HEPARIN SODIUM (PORCINE) 1000 UNIT/ML IJ SOLN
INTRAMUSCULAR | Status: AC
  Filled 2018-09-29: qty 1

## 2018-09-29 MED ORDER — IOHEXOL 300 MG/ML  SOLN
150.0000 mL | Freq: Once | INTRAMUSCULAR | Status: AC | PRN
  Administered 2018-09-29: 90 mL via INTRA_ARTERIAL

## 2018-09-29 MED ORDER — LIDOCAINE HCL 1 % IJ SOLN
INTRAMUSCULAR | Status: AC
  Filled 2018-09-29: qty 20

## 2018-09-29 MED ORDER — NITROGLYCERIN 1 MG/10 ML FOR IR/CATH LAB
INTRA_ARTERIAL | Status: AC
  Filled 2018-09-29: qty 10

## 2018-09-29 MED ORDER — SODIUM CHLORIDE 0.9 % IV SOLN: INTRAVENOUS | Status: AC

## 2018-09-29 MED ORDER — IOPAMIDOL (ISOVUE-300) INJECTION 61%
INTRAVENOUS | Status: AC
  Administered 2018-09-29: 10 mL
  Filled 2018-09-29: qty 50

## 2018-09-29 MED ORDER — FENTANYL CITRATE (PF) 100 MCG/2ML IJ SOLN
INTRAMUSCULAR | Status: AC | PRN
  Administered 2018-09-29: 25 ug via INTRAVENOUS

## 2018-09-29 MED ORDER — SODIUM CHLORIDE 0.9 % IV SOLN
INTRAVENOUS | Status: AC | PRN
  Administered 2018-09-29: 10 mL/h via INTRAVENOUS

## 2018-09-29 MED ORDER — VERAPAMIL HCL 2.5 MG/ML IV SOLN
INTRAVENOUS | Status: AC
  Filled 2018-09-29: qty 2

## 2018-09-29 MED ORDER — LIDOCAINE HCL 1 % IJ SOLN
INTRAMUSCULAR | Status: AC | PRN
  Administered 2018-09-29: 2 mL

## 2018-09-29 MED ORDER — VERAPAMIL HCL 2.5 MG/ML IV SOLN
INTRA_ARTERIAL | Status: AC | PRN
  Administered 2018-09-29: 09:00:00 via INTRA_ARTERIAL

## 2018-09-29 MED ORDER — SODIUM CHLORIDE 0.9 % IV SOLN: Freq: Once | INTRAVENOUS | Status: DC

## 2018-09-29 MED ORDER — DEXTROSE 5 % IV BOLUS: 250.0000 mL | Freq: Once | INTRAVENOUS | Status: DC

## 2018-09-29 NOTE — Procedures (Signed)
S/P 4 vessel cerebral arteriogram. RT CFA approach. Findings. 1.No angio evidence of aneurysm,DAVF,AVM ,dissectionor stenosis or occlusion.2.Venous outflow WNLs.
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
## 2018-09-29 NOTE — Discharge Instructions (Addendum)
Radial Site Care °Refer to this sheet in the next few weeks. These instructions provide you with information about caring for yourself after your procedure. Your health care provider may also give you more specific instructions. Your treatment has been planned according to current medical practices, but problems sometimes occur. Call your health care provider if you have any problems or questions after your procedure. °What can I expect after the procedure? °After your procedure, it is typical to have the following: °· Bruising at the radial site that usually fades within 1-2 weeks. °· Blood collecting in the tissue (hematoma) that may be painful to the touch. It should usually decrease in size and tenderness within 1-2 weeks. ° °Follow these instructions at home: °· Take medicines only as directed by your health care provider. °· You may shower 24-48 hours after the procedure or as directed by your health care provider. Remove the bandage (dressing) and gently wash the site with plain soap and water. Pat the area dry with a clean towel. Do not rub the site, because this may cause bleeding. °· Do not take baths, swim, or use a hot tub until your health care provider approves. °· Check your insertion site every day for redness, swelling, or drainage. °· Do not apply powder or lotion to the site. °· Do not flex or bend the affected arm for 24 hours or as directed by your health care provider. °· Do not push or pull heavy objects with the affected arm for 24 hours or as directed by your health care provider. °· Do not lift over 10 lb (4.5 kg) for 5 days after your procedure or as directed by your health care provider. °· Ask your health care provider when it is okay to: °? Return to work or school. °? Resume usual physical activities or sports. °? Resume sexual activity. °· Do not drive home if you are discharged the same day as the procedure. Have someone else drive you. °· You may drive 24 hours after the procedure  unless otherwise instructed by your health care provider. °· Do not operate machinery or power tools for 24 hours after the procedure. °· If your procedure was done as an outpatient procedure, which means that you went home the same day as your procedure, a responsible adult should be with you for the first 24 hours after you arrive home. °· Keep all follow-up visits as directed by your health care provider. This is important. °Contact a health care provider if: °· You have a fever. °· You have chills. °· You have increased bleeding from the radial site. Hold pressure on the site. °Get help right away if: °· You have unusual pain at the radial site. °· You have redness, warmth, or swelling at the radial site. °· You have drainage (other than a small amount of blood on the dressing) from the radial site. °· The radial site is bleeding, and the bleeding does not stop after 30 minutes of holding steady pressure on the site. °· Your arm or hand becomes pale, cool, tingly, or numb. °This information is not intended to replace advice given to you by your health care provider. Make sure you discuss any questions you have with your health care provider. °Document Released: 11/27/2010 Document Revised: 04/01/2016 Document Reviewed: 05/13/2014 °Elsevier Interactive Patient Education © 2018 Elsevier Inc. ° ° ° °Moderate Conscious Sedation, Adult, Care After °These instructions provide you with information about caring for yourself after your procedure. Your health care provider   may also give you more specific instructions. Your treatment has been planned according to current medical practices, but problems sometimes occur. Call your health care provider if you have any problems or questions after your procedure. °What can I expect after the procedure? °After your procedure, it is common: °· To feel sleepy for several hours. °· To feel clumsy and have poor balance for several hours. °· To have poor judgment for several  hours. °· To vomit if you eat too soon. ° °Follow these instructions at home: °For at least 24 hours after the procedure: ° °· Do not: °? Participate in activities where you could fall or become injured. °? Drive. °? Use heavy machinery. °? Drink alcohol. °? Take sleeping pills or medicines that cause drowsiness. °? Make important decisions or sign legal documents. °? Take care of children on your own. °· Rest. °Eating and drinking °· Follow the diet recommended by your health care provider. °· If you vomit: °? Drink water, juice, or soup when you can drink without vomiting. °? Make sure you have little or no nausea before eating solid foods. °General instructions °· Have a responsible adult stay with you until you are awake and alert. °· Take over-the-counter and prescription medicines only as told by your health care provider. °· If you smoke, do not smoke without supervision. °· Keep all follow-up visits as told by your health care provider. This is important. °Contact a health care provider if: °· You keep feeling nauseous or you keep vomiting. °· You feel light-headed. °· You develop a rash. °· You have a fever. °Get help right away if: °· You have trouble breathing. °This information is not intended to replace advice given to you by your health care provider. Make sure you discuss any questions you have with your health care provider. °Document Released: 08/15/2013 Document Revised: 03/29/2016 Document Reviewed: 02/14/2016 °Elsevier Interactive Patient Education © 2018 Elsevier Inc. ° °

## 2018-10-02 ENCOUNTER — Encounter (HOSPITAL_COMMUNITY): Payer: Self-pay | Admitting: Interventional Radiology

## 2018-10-03 ENCOUNTER — Other Ambulatory Visit (HOSPITAL_COMMUNITY): Payer: Self-pay | Admitting: Interventional Radiology

## 2018-10-03 ENCOUNTER — Encounter (HOSPITAL_COMMUNITY): Payer: Self-pay | Admitting: Radiology

## 2018-10-03 DIAGNOSIS — I639 Cerebral infarction, unspecified: Secondary | ICD-10-CM

## 2018-10-04 ENCOUNTER — Ambulatory Visit: Payer: BLUE CROSS/BLUE SHIELD | Admitting: Sports Medicine

## 2018-10-10 ENCOUNTER — Ambulatory Visit (INDEPENDENT_AMBULATORY_CARE_PROVIDER_SITE_OTHER): Payer: BLUE CROSS/BLUE SHIELD | Admitting: Sports Medicine

## 2018-10-10 ENCOUNTER — Encounter: Payer: Self-pay | Admitting: Sports Medicine

## 2018-10-10 DIAGNOSIS — L84 Corns and callosities: Secondary | ICD-10-CM

## 2018-10-10 DIAGNOSIS — E1142 Type 2 diabetes mellitus with diabetic polyneuropathy: Secondary | ICD-10-CM

## 2018-10-10 DIAGNOSIS — Z794 Long term (current) use of insulin: Secondary | ICD-10-CM | POA: Diagnosis not present

## 2018-10-10 MED ORDER — SEMAGLUTIDE (1 MG/DOSE) 2 MG/1.5ML ~~LOC~~ SOPN: 1.0000 mg | PEN_INJECTOR | SUBCUTANEOUS | 11 refills | Status: DC

## 2018-10-10 MED ORDER — SEMAGLUTIDE(0.25 OR 0.5MG/DOS) 2 MG/1.5ML ~~LOC~~ SOPN: 0.5000 mg | PEN_INJECTOR | SUBCUTANEOUS | 11 refills | Status: DC

## 2018-10-10 NOTE — Assessment & Plan Note (Signed)
There does appear to be a slight recurrence but it is approximately 70 to 80% better compared to before we did the excision, I trimmed off some of the devitalized skin. I think controlling his diabetes better will also be helpful.
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
## 2018-10-10 NOTE — Assessment & Plan Note (Addendum)
William Bean was discontinued. The patient has been on metformin already. He is agreeable to try a GLP-1, he most certainly has some insulin resistance even if the endocrinologist suspect that he is more of a type I diabetic/MODY.   If anything it will help him lose weight. Switching to Ozempic. A1c was not well controlled. He does have an endocrinologist.

## 2018-10-18 DIAGNOSIS — E1042 Type 1 diabetes mellitus with diabetic polyneuropathy: Secondary | ICD-10-CM | POA: Diagnosis not present

## 2018-10-18 DIAGNOSIS — E291 Testicular hypofunction: Secondary | ICD-10-CM | POA: Diagnosis not present

## 2018-10-18 DIAGNOSIS — E1065 Type 1 diabetes mellitus with hyperglycemia: Secondary | ICD-10-CM | POA: Diagnosis not present

## 2018-10-18 DIAGNOSIS — E669 Obesity, unspecified: Secondary | ICD-10-CM | POA: Diagnosis not present

## 2018-11-07 ENCOUNTER — Ambulatory Visit (INDEPENDENT_AMBULATORY_CARE_PROVIDER_SITE_OTHER): Payer: BLUE CROSS/BLUE SHIELD | Admitting: Sports Medicine

## 2018-11-07 ENCOUNTER — Encounter: Payer: Self-pay | Admitting: Sports Medicine

## 2018-11-07 DIAGNOSIS — L84 Corns and callosities: Secondary | ICD-10-CM | POA: Diagnosis not present

## 2018-11-07 NOTE — Assessment & Plan Note (Signed)
Now completely resolved after surgical excision. Return as needed.
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
## 2018-12-01 DIAGNOSIS — H2513 Age-related nuclear cataract, bilateral: Secondary | ICD-10-CM | POA: Diagnosis not present

## 2018-12-01 DIAGNOSIS — H35033 Hypertensive retinopathy, bilateral: Secondary | ICD-10-CM | POA: Diagnosis not present

## 2018-12-01 DIAGNOSIS — E109 Type 1 diabetes mellitus without complications: Secondary | ICD-10-CM | POA: Diagnosis not present

## 2018-12-01 DIAGNOSIS — H34231 Retinal artery branch occlusion, right eye: Secondary | ICD-10-CM | POA: Diagnosis not present

## 2018-12-08 DIAGNOSIS — H524 Presbyopia: Secondary | ICD-10-CM | POA: Diagnosis not present

## 2018-12-11 ENCOUNTER — Encounter (HOSPITAL_COMMUNITY): Payer: Self-pay | Admitting: Interventional Radiology

## 2019-01-29 ENCOUNTER — Other Ambulatory Visit: Payer: Self-pay | Admitting: Osteopathic Medicine

## 2019-01-31 DIAGNOSIS — E669 Obesity, unspecified: Secondary | ICD-10-CM | POA: Diagnosis not present

## 2019-01-31 DIAGNOSIS — E291 Testicular hypofunction: Secondary | ICD-10-CM | POA: Diagnosis not present

## 2019-01-31 DIAGNOSIS — E1065 Type 1 diabetes mellitus with hyperglycemia: Secondary | ICD-10-CM | POA: Diagnosis not present

## 2019-01-31 DIAGNOSIS — E1042 Type 1 diabetes mellitus with diabetic polyneuropathy: Secondary | ICD-10-CM | POA: Diagnosis not present

## 2019-04-18 ENCOUNTER — Ambulatory Visit: Payer: BC Managed Care – PPO | Admitting: Family Medicine

## 2019-06-05 DIAGNOSIS — E291 Testicular hypofunction: Secondary | ICD-10-CM | POA: Diagnosis not present

## 2019-06-05 DIAGNOSIS — E1042 Type 1 diabetes mellitus with diabetic polyneuropathy: Secondary | ICD-10-CM | POA: Diagnosis not present

## 2019-06-05 DIAGNOSIS — R948 Abnormal results of function studies of other organs and systems: Secondary | ICD-10-CM | POA: Diagnosis not present

## 2019-06-05 DIAGNOSIS — E1065 Type 1 diabetes mellitus with hyperglycemia: Secondary | ICD-10-CM | POA: Diagnosis not present

## 2019-06-05 DIAGNOSIS — E669 Obesity, unspecified: Secondary | ICD-10-CM | POA: Diagnosis not present

## 2019-06-19 ENCOUNTER — Telehealth: Payer: Self-pay

## 2019-06-19 NOTE — Telephone Encounter (Signed)
LM2CB  Pt is overdue for [facility] f/u appt. There are virtual appts available this week.

## 2019-06-29 ENCOUNTER — Ambulatory Visit (INDEPENDENT_AMBULATORY_CARE_PROVIDER_SITE_OTHER): Payer: Medicare Other | Admitting: Sports Medicine

## 2019-06-29 ENCOUNTER — Encounter: Payer: Self-pay | Admitting: Sports Medicine

## 2019-06-29 ENCOUNTER — Other Ambulatory Visit: Payer: Self-pay

## 2019-06-29 DIAGNOSIS — R29818 Other symptoms and signs involving the nervous system: Secondary | ICD-10-CM

## 2019-06-29 DIAGNOSIS — R0789 Other chest pain: Secondary | ICD-10-CM | POA: Diagnosis not present

## 2019-06-29 DIAGNOSIS — H8113 Benign paroxysmal vertigo, bilateral: Secondary | ICD-10-CM

## 2019-06-29 DIAGNOSIS — H8110 Benign paroxysmal vertigo, unspecified ear: Secondary | ICD-10-CM | POA: Insufficient documentation

## 2019-06-29 MED ORDER — PREDNISONE 50 MG PO TABS: 50.0000 mg | ORAL_TABLET | Freq: Every day | ORAL | 0 refills | Status: DC

## 2019-06-29 MED ORDER — DIAZEPAM 5 MG PO TABS: 5.0000 mg | ORAL_TABLET | Freq: Three times a day (TID) | ORAL | 0 refills | Status: DC | PRN
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
## 2019-06-29 NOTE — Assessment & Plan Note (Addendum)
Negative nuclear stress test in March 2019. That makes this highly unlikely to be cardiac. He does have some stabbing epigastric pain occasionally, I would like him to touch base with gastroenterology for this. To this could certainly represent splenic flexure syndrome we need to ensure he does not have a gastric or esophageal pain generator.

## 2019-06-29 NOTE — Assessment & Plan Note (Addendum)
Adding Valium, prednisone. Referral for vestibular rehabilitation. I would like a brain MRI to ensure there is not a central source of his vertigo.

## 2019-06-30 LAB — CK TOTAL AND CKMB (NOT AT ARMC)
CK, MB: 2.1 ng/mL (ref 0–5.0)
Relative Index: 2.5 (ref 0–4.0)
Total CK: 84 U/L (ref 44–196)

## 2019-06-30 LAB — CBC
HCT: 42.2 % (ref 38.5–50.0)
Hemoglobin: 14.2 g/dL (ref 13.2–17.1)
MCH: 28.8 pg (ref 27.0–33.0)
MCHC: 33.6 g/dL (ref 32.0–36.0)
MCV: 85.6 fL (ref 80.0–100.0)
MPV: 11.7 fL (ref 7.5–12.5)
Platelets: 149 10*3/uL (ref 140–400)
RBC: 4.93 10*6/uL (ref 4.20–5.80)
RDW: 13 % (ref 11.0–15.0)
WBC: 6.6 10*3/uL (ref 3.8–10.8)

## 2019-06-30 LAB — COMPLETE METABOLIC PANEL WITH GFR
AG Ratio: 1.8 (calc) (ref 1.0–2.5)
ALT: 22 U/L (ref 9–46)
AST: 15 U/L (ref 10–35)
Albumin: 4.2 g/dL (ref 3.6–5.1)
Alkaline phosphatase (APISO): 91 U/L (ref 35–144)
BUN: 17 mg/dL (ref 7–25)
CO2: 31 mmol/L (ref 20–32)
Calcium: 9.6 mg/dL (ref 8.6–10.3)
Chloride: 99 mmol/L (ref 98–110)
Creat: 1.04 mg/dL (ref 0.70–1.25)
GFR, Est African American: 87 mL/min/{1.73_m2} (ref >=60)
GFR, Est Non African American: 75 mL/min/{1.73_m2} (ref >=60)
Globulin: 2.3 g/dL (calc) (ref 1.9–3.7)
Glucose, Bld: 356 mg/dL — ABNORMAL HIGH (ref 65–99)
Potassium: 5.1 mmol/L (ref 3.5–5.3)
Sodium: 137 mmol/L (ref 135–146)
Total Bilirubin: 0.8 mg/dL (ref 0.2–1.2)
Total Protein: 6.5 g/dL (ref 6.1–8.1)

## 2019-06-30 LAB — AMYLASE: Amylase: 46 U/L (ref 21–101)

## 2019-06-30 LAB — LIPASE: Lipase: 28 U/L (ref 7–60)

## 2019-06-30 LAB — TROPONIN I: Troponin I: <0.01 ng/mL (ref <=0.0)

## 2019-07-02 ENCOUNTER — Ambulatory Visit (INDEPENDENT_AMBULATORY_CARE_PROVIDER_SITE_OTHER): Payer: Medicare Other | Admitting: Osteopathic Medicine

## 2019-07-02 ENCOUNTER — Encounter: Payer: Self-pay | Admitting: Osteopathic Medicine

## 2019-07-02 ENCOUNTER — Other Ambulatory Visit: Payer: Self-pay

## 2019-07-02 ENCOUNTER — Ambulatory Visit: Payer: BC Managed Care – PPO | Admitting: Osteopathic Medicine

## 2019-07-02 VITALS — BP 137/82 | HR 91 | Temp 97.9°F | Wt 244.7 lb

## 2019-07-02 DIAGNOSIS — Z794 Long term (current) use of insulin: Secondary | ICD-10-CM

## 2019-07-02 DIAGNOSIS — H8113 Benign paroxysmal vertigo, bilateral: Secondary | ICD-10-CM | POA: Diagnosis not present

## 2019-07-02 DIAGNOSIS — E1142 Type 2 diabetes mellitus with diabetic polyneuropathy: Secondary | ICD-10-CM | POA: Diagnosis not present

## 2019-07-02 DIAGNOSIS — Z23 Encounter for immunization: Secondary | ICD-10-CM

## 2019-07-02 LAB — POCT GLYCOSYLATED HEMOGLOBIN (HGB A1C): Hemoglobin A1C: 9.3 % — AB (ref 4.0–5.6)

## 2019-07-02 NOTE — Patient Instructions (Signed)
Plan:  Vertigo: Will see if we can expedite the MRI. Can take the Valium for severe symptoms. See printed information for Epley maneuvers at home.   Labs: sugars high but no other evidence of diabetic complications.   Medications: will increase the Novolin N from 43 units twice daily to 45 units twice daily for 2 days, then 48 twice daily for 2 days, and so on increase by 3 units at a time every 2 days until fasting sugars are 150 or so then stay at that dose. I'll forward today's notes to Dr. Ronne Binning, and if she has any changes to make, please follow her instructions!

## 2019-07-02 NOTE — Progress Notes (Signed)
HPI: William Bean is a 63 y.o. male who  has a past medical history of Asthma, Gout, History of asthma (09/19/2017), Hyperlipidemia, Hypertension, Pyloric stenosis, Single kidney, and Type 2 diabetes mellitus ([place]).  he presents to [facility] today, 07/02/19,  for chief complaint of:  Follow-up Glc Vertigo  Patient seen by colleague of mine 3 days ago, concern for vertigo.  Diagnosed with BPPV, bilateral positive Dix-Hallpike on exam, Dr. Darene Lamer initiated on Valium and steroid burst, MRI currently pending, vestibular rehab referral currently pending.  Labs revealed significantly elevated blood sugar.  Patient was advised to follow-up with me regarding diabetes control, he hasn't yet received this message, he and wife were not aware of MRI plan, he is here today to recheck sugars since these were high over the weekend.  I have recent report from [name], where he is seeing endocrinology for diabetes, hypogonadism.  A1c was ordered at that visit but I do not have the results.  Last A1c on record is 7.9 in March 2020.  Patient reports that A1c from most recent visit was also 7.9.  Patient is taking Novolin N 42 units twice daily, has been prescribed but is not taking Novolin R 5 units with dinner.    I inquired if there were any financial reasons that he was taking the Novolin insulins as opposed to something a bit more recently developed such as Engineer, agricultural, [place] [employer].  Patient states that even with insurance coverage, these were fairly expensive, the Novolin's have been fairly inexpensive, but he is not opposed to changing medication regimens now that he has better coverage with Medicare.       At today's visit 07/02/19 ... PMH, PSH, FH reviewed and updated as needed.  Current medication list and allergy/intolerance hx reviewed and updated as needed. (See remainder of HPI, ROS, Phys Exam below)   No results found.   Results for orders  placed or performed in visit on 07/02/19 (from the past 24 hour(s))  POCT HgB A1C     Status: Abnormal   Collection Time: 07/02/19 10:42 AM  Result Value Ref Range   Hemoglobin A1C 9.3 (A) 4.0 - 5.6 %   HbA1c POC (<> result, manual entry)     HbA1c, POC (prediabetic range)     HbA1c, POC (controlled diabetic range)       Results for orders placed or performed in visit on 07/02/19 (from the past 72 hour(s))  POCT HgB A1C     Status: Abnormal   Collection Time: 07/02/19 10:42 AM  Result Value Ref Range   Hemoglobin A1C 9.3 (A) 4.0 - 5.6 %   HbA1c POC (<> result, manual entry)     HbA1c, POC (prediabetic range)     HbA1c, POC (controlled diabetic range)            ASSESSMENT/PLAN: The primary encounter diagnosis was Type 2 diabetes mellitus with diabetic polyneuropathy, with long-term current use of insulin ([place]). A diagnosis of Benign paroxysmal positional vertigo due to bilateral vestibular disorder was also pertinent to this visit.   A1c has taken quite a jump, patient is advised that I am not sure we can totally attribute vertigo to increase in sugars though his sugars are certainly not helping the matter.  Instructions given to titrate up on the Novolin, we will route this note as well to his endocrinology team and see if they have any other further input or would like to see him in the  office.  Patient has a bit of a complicated type II versus type I picture.  As far as vertigo is concerned, he has not taken the prednisone or the Valium.  I think reasonable to withhold prednisone given patient's concerns about the blood sugars.  Advised that he can take Valium as needed, can also try Benadryl or Dramamine.  Patient was given modified Epley maneuvers to do at home.  Orders Placed This Encounter  Procedures  . POCT HgB A1C     No orders of the defined types were placed in this encounter.   Patient Instructions  Plan:  Vertigo: Will see if we can expedite the MRI. Can  take the Valium for severe symptoms. See printed information for Epley maneuvers at home.   Labs: sugars high but no other evidence of diabetic complications.   Medications: will increase the Novolin N from 43 units twice daily to 45 units twice daily for 2 days, then 48 twice daily for 2 days, and so on increase by 3 units at a time every 2 days until fasting sugars are 150 or so then stay at that dose. I'll forward today's notes to Dr. Ronne Binning, and if she has any changes to make, please follow her instructions!          Follow-up plan: Return in about 1 week (around 07/09/2019) for virtual visit w/ Dr Sheppard Coil to recheck vertigo and check up on sugars. Call sooner if needed!.                                                 ################################################# ################################################# ################################################# #################################################    Current Meds  Medication Sig  . AMBULATORY NON FORMULARY MEDICATION Glucometer: Verio One Touch.  Use to check blood sugar up to three times a day.  . AMBULATORY NON FORMULARY MEDICATION HPR Plus Cream, Sig: 1 application once or twice daily as needed for dry irritated skin  . aspirin 81 MG tablet Take 81 mg by mouth daily.  Marland Kitchen atorvastatin (LIPITOR) 40 MG tablet Take 1 tablet (40 mg total) by mouth daily.  . diazepam (VALIUM) 5 MG tablet Take 1 tablet (5 mg total) by mouth every 8 (eight) hours as needed (vertigo/dizziness).  Marland Kitchen glucose 4 GM chewable tablet Chew 1 tablet (4 g total) by mouth as needed for low blood sugar.  Marland Kitchen glucose blood (ONETOUCH VERIO) test strip 1 each by Other route 2 (two) times daily. And lancets 2/day 250.01  . insulin NPH Human (HUMULIN N,NOVOLIN N) 100 UNIT/ML injection Inject 40 Units into the skin 2 (two) times daily.  Marland Kitchen lisinopril (PRINIVIL,ZESTRIL) 5 MG tablet TAKE ONE TABLET BY MOUTH  DAILY  . Multiple Vitamin (MULTIVITAMIN) tablet Take 1 tablet by mouth daily.   . Omega-3 Fatty Acids (FISH OIL) 500 MG CAPS Take 1 tablet by mouth daily.   . predniSONE (DELTASONE) 50 MG tablet Take 1 tablet (50 mg total) by mouth daily.    Allergies  Allergen Reactions  . Penicillins Other (See Comments)    Unknown reaction As a baby          Review of Systems:  Constitutional: No recent illness except vertigo and hyperglycemia, no fever  HEENT: No  headache, no vision change  Cardiac: No  chest pain, No  pressure, No palpitations  Respiratory:  No  shortness of breath. No  Cough  Gastrointestinal: No  abdominal pain, no change on bowel habits, no nausea  Musculoskeletal: No new myalgia/arthralgia  Skin: No  Rash  Neurologic: No  weakness, +Dizziness  Psychiatric: No  concerns with depression, No  concerns with anxiety  Exam:  BP 137/82 (BP Location: Left Arm, Patient Position: Sitting, Cuff Size: Normal)   Pulse 91   Temp 97.9 F (36.6 C) (Oral)   Wt 244 lb 11.2 oz (111 kg)   BMI 32.28 kg/m   Constitutional: VS see above. General Appearance: alert, well-developed, well-nourished, NAD  Eyes: Normal lids and conjunctive, non-icteric sclera  Neck: No masses, trachea midline.   Respiratory: Normal respiratory effort.   Musculoskeletal: Gait not examined, patient using wheelchair due to concern he might fall due to vertigo.  Neurological: No tremor.  Skin: warm, dry, intact.   Psychiatric: Normal judgment/insight. Normal mood and affect. Oriented x3.       Visit summary with medication list and pertinent instructions was printed for patient to review, patient was advised to alert [place] if any updates are needed. All questions at time of visit were answered - patient instructed to contact office with any additional concerns. ER/RTC precautions were reviewed with the patient and understanding verbalized.   Note: Total time spent 25 minutes, greater than  50% of the visit was spent face-to-face counseling and coordinating care for the following: The primary encounter diagnosis was Type 2 diabetes mellitus with diabetic polyneuropathy, with long-term current use of insulin ([place]). A diagnosis of Benign paroxysmal positional vertigo due to bilateral vestibular disorder was also pertinent to this visit.Marland Kitchen  Please note: voice recognition software was used to produce this document, and typos may escape review. Please contact Dr. Sheppard Coil for any needed clarifications.    Follow up plan: Return in about 1 week (around 07/09/2019) for virtual visit w/ Dr Sheppard Coil to recheck vertigo and check up on sugars. Call sooner if needed!Marland Kitchen

## 2019-07-03 ENCOUNTER — Ambulatory Visit: Payer: Medicare Other | Admitting: Physical Therapy

## 2019-07-03 NOTE — Addendum Note (Signed)
Addended by: Mertha Finders on: 07/03/2019 09:12 AM   Modules accepted: Orders

## 2019-07-09 ENCOUNTER — Other Ambulatory Visit: Payer: Self-pay

## 2019-07-09 ENCOUNTER — Ambulatory Visit (INDEPENDENT_AMBULATORY_CARE_PROVIDER_SITE_OTHER): Payer: Medicare Other

## 2019-07-09 DIAGNOSIS — R29818 Other symptoms and signs involving the nervous system: Secondary | ICD-10-CM

## 2019-07-09 DIAGNOSIS — H8113 Benign paroxysmal vertigo, bilateral: Secondary | ICD-10-CM | POA: Diagnosis not present

## 2019-07-09 MED ORDER — GADOBUTROL 1 MMOL/ML IV SOLN
10.0000 mL | Freq: Once | INTRAVENOUS | Status: AC | PRN
  Administered 2019-07-09: 11:00:00 10 mL via INTRAVENOUS

## 2019-07-10 ENCOUNTER — Encounter: Payer: Self-pay | Admitting: Osteopathic Medicine

## 2019-07-10 ENCOUNTER — Ambulatory Visit (INDEPENDENT_AMBULATORY_CARE_PROVIDER_SITE_OTHER): Payer: Medicare Other | Admitting: Osteopathic Medicine

## 2019-07-10 DIAGNOSIS — E1142 Type 2 diabetes mellitus with diabetic polyneuropathy: Secondary | ICD-10-CM | POA: Diagnosis not present

## 2019-07-10 DIAGNOSIS — Z794 Long term (current) use of insulin: Secondary | ICD-10-CM | POA: Diagnosis not present

## 2019-07-10 DIAGNOSIS — H8113 Benign paroxysmal vertigo, bilateral: Secondary | ICD-10-CM
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
## 2019-07-11 ENCOUNTER — Ambulatory Visit: Payer: Medicare Other | Admitting: Physical Therapy

## 2019-07-21 ENCOUNTER — Other Ambulatory Visit: Payer: Self-pay | Admitting: Osteopathic Medicine

## 2019-07-21 ENCOUNTER — Other Ambulatory Visit: Payer: Self-pay | Admitting: Family Medicine

## 2019-07-21 NOTE — Telephone Encounter (Signed)
Requested medication (s) are due for refill today: yes  Requested medication (s) are on the active medication list: yes  Last refill:  06/06/2019  Future visit scheduled: no  Notes to clinic: review for refill   Requested Prescriptions  Pending Prescriptions Disp Refills   atorvastatin (LIPITOR) 40 MG tablet [Pharmacy Med Name: ATORVASTATIN 40 MG TABLET] 90 tablet 2    Sig: TAKE ONE TABLET BY MOUTH DAILY     Cardiovascular:  Antilipid - Statins Failed - 07/21/2019 10:21 AM      Failed - Total Cholesterol in normal range and within 360 days    Cholesterol  Date Value Ref Range Status  07/24/2018 155 <200 mg/dL Final         Failed - LDL in normal range and within 360 days    LDL Cholesterol (Calc)  Date Value Ref Range Status  07/24/2018 84 mg/dL (calc) Final    Comment:    Reference range: <100 . Desirable range <100 mg/dL for primary prevention;   <70 mg/dL for patients with CHD or diabetic patients  with > or = 2 CHD risk factors. Marland Kitchen LDL-C is now calculated using the Martin-Hopkins  calculation, which is a validated novel method providing  better accuracy than the Friedewald equation in the  estimation of LDL-C.  Cresenciano Genre et al. Annamaria Helling. MU:[id number]): 2061-2068  (http://education.[employer].com/faq/FAQ164)          Failed - HDL in normal range and within 360 days    HDL  Date Value Ref Range Status  07/24/2018 51 >40 mg/dL Final         Failed - Triglycerides in normal range and within 360 days    Triglycerides  Date Value Ref Range Status  07/24/2018 107 <150 mg/dL Final         Failed - Valid encounter within last 12 months    Recent Outpatient Visits          1 week ago Type 2 diabetes mellitus with diabetic polyneuropathy, with long-term current use of insulin ([place])   [facility], Lanelle Bal, DO   2 weeks ago Type 2 diabetes mellitus with diabetic polyneuropathy, with long-term current use of insulin [facility])    [facility], Lanelle Bal, DO   3 weeks ago Benign paroxysmal positional vertigo due to bilateral vestibular disorder   [facility], Gwen Her, MD   8 months ago Corn of foot   [place], Gwen Her, MD   9 months ago Corn of foot   [facility], Gwen Her, MD             Passed - Patient is not pregnant

## 2019-07-27 ENCOUNTER — Telehealth: Payer: Self-pay | Admitting: Osteopathic Medicine

## 2019-07-27 ENCOUNTER — Ambulatory Visit: Payer: BC Managed Care – PPO | Admitting: Sports Medicine

## 2019-07-27 NOTE — Telephone Encounter (Signed)
Spouse, called. Following up on referral to [facility].  Thanks

## 2019-08-02 ENCOUNTER — Encounter: Payer: Self-pay | Admitting: Internal Medicine

## 2019-08-02 NOTE — Telephone Encounter (Signed)
[place] called patient to schedule and left a voicemail for them to call back. - CF

## 2019-08-10 ENCOUNTER — Other Ambulatory Visit: Payer: Self-pay

## 2019-08-10 ENCOUNTER — Ambulatory Visit (INDEPENDENT_AMBULATORY_CARE_PROVIDER_SITE_OTHER): Payer: Medicare Other | Admitting: Gastroenterology

## 2019-08-10 ENCOUNTER — Encounter: Payer: Self-pay | Admitting: Gastroenterology

## 2019-08-10 VITALS — BP 134/70 | HR 82 | Temp 97.8°F | Ht 73.0 in | Wt 248.2 lb

## 2019-08-10 DIAGNOSIS — R1013 Epigastric pain: Secondary | ICD-10-CM | POA: Diagnosis not present

## 2019-08-10 DIAGNOSIS — R195 Other fecal abnormalities: Secondary | ICD-10-CM

## 2019-08-10 DIAGNOSIS — Z1211 Encounter for screening for malignant neoplasm of colon: Secondary | ICD-10-CM | POA: Diagnosis not present

## 2019-08-10 DIAGNOSIS — R140 Abdominal distension (gaseous): Secondary | ICD-10-CM | POA: Diagnosis not present

## 2019-08-10 MED ORDER — SUPREP BOWEL PREP KIT 17.5-3.13-1.6 GM/177ML PO SOLN: 1.0000 | ORAL | 0 refills | Status: DC
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
## 2019-08-10 NOTE — Patient Instructions (Signed)
If you are age 65 or older, your body mass index should be between 23-30. Your Body mass index is 32.75 kg/m. If this is out of the aforementioned range listed, please consider follow up with your Primary Care Provider.  If you are age 53 or younger, your body mass index should be between 19-25. Your Body mass index is 32.75 kg/m. If this is out of the aformentioned range listed, please consider follow up with your Primary Care Provider.   You have been scheduled for an endoscopy and colonoscopy. Please follow the written instructions given to you at your visit today. Please pick up your prep supplies at the pharmacy within the next 1-3 days. If you use inhalers (even only as needed), please bring them with you on the day of your procedure. Your physician has requested that you go to www.startemmi.com and enter the access code given to you at your visit today. This web site gives a general overview about your procedure. However, you should still follow specific instructions given to you by our office regarding your preparation for the procedure.  Please purchase the following medications over the counter and take as directed: Probiotic  It was a pleasure to see you today!  William Bean, D.O.

## 2019-08-15 LAB — HEMOGLOBIN A1C: Hemoglobin A1C: 8

## 2019-09-07 ENCOUNTER — Other Ambulatory Visit: Payer: Self-pay

## 2019-09-07 ENCOUNTER — Telehealth: Payer: Self-pay

## 2019-09-07 ENCOUNTER — Other Ambulatory Visit: Payer: Self-pay | Admitting: Gastroenterology

## 2019-09-07 DIAGNOSIS — Z1159 Encounter for screening for other viral diseases: Secondary | ICD-10-CM

## 2019-09-07 LAB — SARS CORONAVIRUS 2 (TAT 6-24 HRS): SARS Coronavirus 2: NEGATIVE

## 2019-09-07 NOTE — Telephone Encounter (Signed)
Left message for patient to call back to the office;  

## 2019-09-07 NOTE — Telephone Encounter (Signed)
Called and spoke with patient-patient had already been contacted and has been scheduled to have his COVID screen at 2:30 today; Patient advised to call back to the office at [phone] should questions/concerns arise;  Patient verbalized understanding of information/instructions;

## 2019-09-07 NOTE — Telephone Encounter (Signed)
-----   Message from Laverna Peace, RN sent at 09/06/2019  5:00 PM EDT ----- This pt's procedure is 09-10-19.  He needs a covid test ordered please.  Thanks, [employer]

## 2019-09-10 ENCOUNTER — Other Ambulatory Visit: Payer: Self-pay

## 2019-09-10 ENCOUNTER — Encounter: Payer: Self-pay | Admitting: Gastroenterology

## 2019-09-10 ENCOUNTER — Ambulatory Visit (AMBULATORY_SURGERY_CENTER): Payer: Medicare Other | Admitting: Gastroenterology

## 2019-09-10 VITALS — BP 141/80 | HR 79 | Temp 97.1°F | Resp 20 | Ht 73.0 in | Wt 248.0 lb

## 2019-09-10 DIAGNOSIS — K2950 Unspecified chronic gastritis without bleeding: Secondary | ICD-10-CM

## 2019-09-10 DIAGNOSIS — K529 Noninfective gastroenteritis and colitis, unspecified: Secondary | ICD-10-CM | POA: Diagnosis not present

## 2019-09-10 DIAGNOSIS — K640 First degree hemorrhoids: Secondary | ICD-10-CM

## 2019-09-10 DIAGNOSIS — Z1211 Encounter for screening for malignant neoplasm of colon: Secondary | ICD-10-CM

## 2019-09-10 DIAGNOSIS — D125 Benign neoplasm of sigmoid colon: Secondary | ICD-10-CM

## 2019-09-10 DIAGNOSIS — K259 Gastric ulcer, unspecified as acute or chronic, without hemorrhage or perforation: Secondary | ICD-10-CM

## 2019-09-10 DIAGNOSIS — R1013 Epigastric pain: Secondary | ICD-10-CM

## 2019-09-10 MED ORDER — OMEPRAZOLE 20 MG PO CPDR: 20.0000 mg | DELAYED_RELEASE_CAPSULE | Freq: Two times a day (BID) | ORAL | 0 refills | Status: DC

## 2019-09-10 MED ORDER — SODIUM CHLORIDE 0.9 % IV SOLN: 500.0000 mL | Freq: Once | INTRAVENOUS | Status: DC

## 2019-09-10 NOTE — Progress Notes (Signed)
Called to room to assist during endoscopic procedure.  Patient ID and intended procedure confirmed with present staff. Received instructions for my participation in the procedure from the performing physician.  

## 2019-09-10 NOTE — Op Note (Signed)
[facility] Patient Name: William Bean Procedure Date: 09/10/2019 1:40 PM MRN: [id number] Endoscopist: Gerrit Heck , MD Age: 63 Referring MD:  Date of Birth: 1956/06/25 Gender: Male Account #: [id number] Procedure:                Upper GI endoscopy Indications:              Epigastric abdominal pain, Heartburn, Diarrhea.                            History of pyloric stenosis s/p intervention in                            infancy. Medicines:                Monitored Anesthesia Care Procedure:                Pre-Anesthesia Assessment:                           - Prior to the procedure, a History and Physical                            was performed, and patient medications and                            allergies were reviewed. The patient's tolerance of                            previous anesthesia was also reviewed. The risks                            and benefits of the procedure and the sedation                            options and risks were discussed with the patient.                            All questions were answered, and informed consent                            was obtained. Prior Anticoagulants: The patient has                            taken no previous anticoagulant or antiplatelet                            agents. ASA Grade Assessment: II - A patient with                            mild systemic disease. After reviewing the risks                            and benefits, the patient was deemed in  satisfactory condition to undergo the procedure.                           After obtaining informed consent, the endoscope was                            passed under direct vision. Throughout the                            procedure, the patient's blood pressure, pulse, and                            oxygen saturations were monitored continuously. The                            Endoscope was introduced through the mouth, and                             advanced to the second part of duodenum. The upper                            GI endoscopy was accomplished without difficulty.                            The patient tolerated the procedure well. Scope In: Scope Out: Findings:                 The examined esophagus was normal.                           The Z-line was regular and was found 40 cm from the                            incisors.                           Mild inflammation characterized by congestion                            (edema), erythema was found in the gastric body, at                            the incisura and in the gastric antrum. There was a                            single, non-bleeding, small erosion in the gastric                            body. Biopsies were taken with a cold forceps for                            Helicobacter pylori testing. Estimated blood loss  was minimal.                           The duodenal bulb, first portion of the duodenum                            and second portion of the duodenum were normal.                            Biopsies for histology were taken with a cold                            forceps for evaluation of celiac disease. Estimated                            blood loss was minimal. Complications:            No immediate complications. Estimated Blood Loss:     Estimated blood loss was minimal. Impression:               - Normal esophagus.                           - Z-line regular, 40 cm from the incisors.                           - Gastritis. Biopsied.                           - Normal duodenal bulb, first portion of the                            duodenum and second portion of the duodenum.                            Biopsied. Recommendation:           - Patient has a contact number available for                            emergencies. The signs and symptoms of potential                            delayed  complications were discussed with the                            patient. Return to normal activities tomorrow.                            Written discharge instructions were provided to the                            patient.                           - Resume previous diet.                           -  Continue present medications.                           - Await pathology results.                           - Perform a colonoscopy today.                           - Return to GI clinic PRN.                           - Use Prilosec (omeprazole) 20 mg PO BID for 8                            weeks. Gerrit Heck, MD 09/10/2019 2:15:08 PM

## 2019-09-10 NOTE — Progress Notes (Signed)
Temp - YF  VS - KA

## 2019-09-10 NOTE — Progress Notes (Signed)
To PACU, VSS. Report to RN.tb 

## 2019-09-10 NOTE — Patient Instructions (Signed)
Please read handouts provided. Continue present medications. Await pathology results. Use fiber, for example Citrucel, Fibercon, Konsyl or Metamucil. Return to GI clinic as needed. Use Prilosec ( omeprazole ) 20 mg twice daily for 8 weeks.       YOU HAD AN ENDOSCOPIC PROCEDURE TODAY AT [facility]:   Refer to the procedure report that was given to you for any specific questions about what was found during the examination.  If the procedure report does not answer your questions, please call your gastroenterologist to clarify.  If you requested that your care partner not be given the details of your procedure findings, then the procedure report has been included in a sealed envelope for you to review at your convenience later.  YOU SHOULD EXPECT: Some feelings of bloating in the abdomen. Passage of more gas than usual.  Walking can help get rid of the air that was put into your GI tract during the procedure and reduce the bloating. If you had a lower endoscopy (such as a colonoscopy or flexible sigmoidoscopy) you may notice spotting of blood in your stool or on the toilet paper. If you underwent a bowel prep for your procedure, you may not have a normal bowel movement for a few days.  Please Note:  You might notice some irritation and congestion in your nose or some drainage.  This is from the oxygen used during your procedure.  There is no need for concern and it should clear up in a day or so.  SYMPTOMS TO REPORT IMMEDIATELY:   Following lower endoscopy (colonoscopy or flexible sigmoidoscopy):  Excessive amounts of blood in the stool  Significant tenderness or worsening of abdominal pains  Swelling of the abdomen that is new, acute  Fever of 100F or higher   Following upper endoscopy (EGD)  Vomiting of blood or coffee ground material  New chest pain or pain under the shoulder blades  Painful or persistently difficult swallowing  New shortness of breath  Fever of  100F or higher  Black, tarry-looking stools  For urgent or emergent issues, a gastroenterologist can be reached at any hour by calling [phone].   DIET:  We do recommend a small meal at first, but then you may proceed to your regular diet.  Drink plenty of fluids but you should avoid alcoholic beverages for 24 hours.  ACTIVITY:  You should plan to take it easy for the rest of today and you should NOT DRIVE or use heavy machinery until tomorrow (because of the sedation medicines used during the test).    FOLLOW UP: Our staff will call the number listed on your records 48-72 hours following your procedure to check on you and address any questions or concerns that you may have regarding the information given to you following your procedure. If we do not reach you, we will leave a message.  We will attempt to reach you two times.  During this call, we will ask if you have developed any symptoms of COVID 19. If you develop any symptoms (ie: fever, flu-like symptoms, shortness of breath, cough etc.) before then, please call [phone].  If you test positive for Covid 19 in the 2 weeks post procedure, please call and report this information to [place].    If any biopsies were taken you will be contacted by phone or by letter within the next 1-3 weeks.  Please call us at [phone] if you have not heard about the biopsies in 3 weeks.  SIGNATURES/CONFIDENTIALITY: You and/or your care partner have signed paperwork which will be entered into your electronic medical record.  These signatures attest to the fact that that the information above on your After Visit Summary has been reviewed and is understood.  Full responsibility of the confidentiality of this discharge information lies with you and/or your care-partner.

## 2019-09-10 NOTE — Op Note (Signed)
[place] Patient Name: William Bean Procedure Date: 09/10/2019 1:40 PM MRN: [id number] Endoscopist: Gerrit Heck , MD Age: 63 Referring MD:  Date of Birth: 1956/03/07 Gender: Male Account #: [id number] Procedure:                Colonoscopy Indications:              Epigastric abdominal pain, Clinically significant                            diarrhea of unexplained origin, Change in bowel                            habits Medicines:                Monitored Anesthesia Care Procedure:                Pre-Anesthesia Assessment:                           - Prior to the procedure, a History and Physical                            was performed, and patient medications and                            allergies were reviewed. The patient's tolerance of                            previous anesthesia was also reviewed. The risks                            and benefits of the procedure and the sedation                            options and risks were discussed with the patient.                            All questions were answered, and informed consent                            was obtained. Prior Anticoagulants: The patient has                            taken no previous anticoagulant or antiplatelet                            agents. ASA Grade Assessment: II - A patient with                            mild systemic disease. After reviewing the risks                            and benefits, the patient was deemed in  satisfactory condition to undergo the procedure.                           After obtaining informed consent, the colonoscope                            was passed under direct vision. Throughout the                            procedure, the patient's blood pressure, pulse, and                            oxygen saturations were monitored continuously. The                            Colonoscope was introduced through the anus and                        advanced to the the terminal ileum. The colonoscopy                            was performed without difficulty. The patient                            tolerated the procedure well. The quality of the                            bowel preparation was excellent. The terminal                            ileum, ileocecal valve, appendiceal orifice, and                            rectum were photographed. Scope In: 1:52:14 PM Scope Out: 2:04:01 PM Scope Withdrawal Time: 0 hours 10 minutes 13 seconds  Total Procedure Duration: 0 hours 11 minutes 47 seconds  Findings:                 The perianal and digital rectal examinations were                            normal.                           Three sessile polyps were found in the sigmoid                            colon. The polyps were 1 to 2 mm in size. These                            polyps were removed with a cold biopsy forceps.                            Resection and retrieval were complete. Estimated  blood loss was minimal.                           Non-bleeding internal hemorrhoids were found during                            retroflexion. The hemorrhoids were small.                           The mucosa was otherwise normal in the remainder of                            the colon. Biopsies for histology were taken with a                            cold forceps from the right colon and left colon                            for evaluation of microscopic colitis. Estimated                            blood loss was minimal.                           The terminal ileum appeared normal. Complications:            No immediate complications. Estimated Blood Loss:     Estimated blood loss was minimal. Impression:               - Three 1 to 2 mm polyps in the sigmoid colon,                            removed with a cold biopsy forceps. Resected and                            retrieved.                            - Non-bleeding internal hemorrhoids.                           - Normal mucosa in the entire examined colon.                            Biopsied.                           - The examined portion of the ileum was normal. Recommendation:           - Patient has a contact number available for                            emergencies. The signs and symptoms of potential                            delayed complications were discussed with the  patient. Return to normal activities tomorrow.                            Written discharge instructions were provided to the                            patient.                           - Resume previous diet.                           - Continue present medications.                           - Await pathology results.                           - Use fiber, for example Citrucel, Fibercon, Konsyl                            or Metamucil.                           - Repeat colonoscopy for surveillance based on                            pathology results.                           - Return to GI office PRN. Gerrit Heck, MD 09/10/2019 2:18:12 PM

## 2019-09-12 ENCOUNTER — Telehealth: Payer: Self-pay

## 2019-09-12 NOTE — Telephone Encounter (Signed)
Left message on follow up call. 

## 2019-09-12 NOTE — Telephone Encounter (Signed)
Second post procedure follow up call, no answer 

## 2019-09-20 ENCOUNTER — Encounter: Payer: Self-pay | Admitting: Gastroenterology

## 2019-11-15 ENCOUNTER — Other Ambulatory Visit: Payer: Self-pay | Admitting: Neurology

## 2019-11-15 ENCOUNTER — Encounter: Payer: Self-pay | Admitting: Osteopathic Medicine

## 2019-11-15 MED ORDER — ATORVASTATIN CALCIUM 40 MG PO TABS: 40.0000 mg | ORAL_TABLET | Freq: Every day | ORAL | 0 refills | Status: DC

## 2019-11-15 MED ORDER — LISINOPRIL 5 MG PO TABS: 5.0000 mg | ORAL_TABLET | Freq: Every day | ORAL | 0 refills | Status: DC

## 2019-11-15 NOTE — Addendum Note (Signed)
Addended byAnnamaria Helling on: 11/15/2019 03:58 PM   Modules accepted: Orders

## 2019-11-16 DIAGNOSIS — H35033 Hypertensive retinopathy, bilateral: Secondary | ICD-10-CM | POA: Diagnosis not present

## 2019-11-16 DIAGNOSIS — E109 Type 1 diabetes mellitus without complications: Secondary | ICD-10-CM | POA: Diagnosis not present

## 2019-11-16 DIAGNOSIS — H2513 Age-related nuclear cataract, bilateral: Secondary | ICD-10-CM | POA: Diagnosis not present

## 2019-11-16 DIAGNOSIS — H34231 Retinal artery branch occlusion, right eye: Secondary | ICD-10-CM | POA: Diagnosis not present

## 2019-11-28 DIAGNOSIS — H401131 Primary open-angle glaucoma, bilateral, mild stage: Secondary | ICD-10-CM | POA: Diagnosis not present

## 2019-11-28 DIAGNOSIS — H524 Presbyopia: Secondary | ICD-10-CM | POA: Diagnosis not present

## 2019-12-05 DIAGNOSIS — H401131 Primary open-angle glaucoma, bilateral, mild stage: Secondary | ICD-10-CM | POA: Diagnosis not present

## 2019-12-05 DIAGNOSIS — H2513 Age-related nuclear cataract, bilateral: Secondary | ICD-10-CM | POA: Diagnosis not present

## 2019-12-10 DIAGNOSIS — K449 Diaphragmatic hernia without obstruction or gangrene: Secondary | ICD-10-CM | POA: Diagnosis not present

## 2019-12-10 DIAGNOSIS — I10 Essential (primary) hypertension: Secondary | ICD-10-CM | POA: Diagnosis not present

## 2019-12-10 DIAGNOSIS — Z79899 Other long term (current) drug therapy: Secondary | ICD-10-CM | POA: Diagnosis not present

## 2019-12-10 DIAGNOSIS — I878 Other specified disorders of veins: Secondary | ICD-10-CM | POA: Diagnosis not present

## 2019-12-10 DIAGNOSIS — Z7982 Long term (current) use of aspirin: Secondary | ICD-10-CM | POA: Diagnosis not present

## 2019-12-10 DIAGNOSIS — K5090 Crohn's disease, unspecified, without complications: Secondary | ICD-10-CM | POA: Diagnosis not present

## 2019-12-10 DIAGNOSIS — Z794 Long term (current) use of insulin: Secondary | ICD-10-CM | POA: Diagnosis not present

## 2019-12-10 DIAGNOSIS — M546 Pain in thoracic spine: Secondary | ICD-10-CM | POA: Diagnosis not present

## 2019-12-10 DIAGNOSIS — N401 Enlarged prostate with lower urinary tract symptoms: Secondary | ICD-10-CM | POA: Diagnosis not present

## 2019-12-10 DIAGNOSIS — E119 Type 2 diabetes mellitus without complications: Secondary | ICD-10-CM | POA: Diagnosis not present

## 2019-12-10 DIAGNOSIS — Z905 Acquired absence of kidney: Secondary | ICD-10-CM | POA: Diagnosis not present

## 2019-12-10 DIAGNOSIS — E785 Hyperlipidemia, unspecified: Secondary | ICD-10-CM | POA: Diagnosis not present

## 2019-12-10 DIAGNOSIS — Z880 Allergy status to penicillin: Secondary | ICD-10-CM | POA: Diagnosis not present

## 2019-12-10 DIAGNOSIS — H53149 Visual discomfort, unspecified: Secondary | ICD-10-CM | POA: Diagnosis not present

## 2019-12-10 MED ORDER — GENERIC EXTERNAL MEDICATION
Status: DC
Start: ? — End: 2019-12-10

## 2019-12-10 MED ORDER — SODIUM CHLORIDE 0.9 % IV SOLN
10.00 | INTRAVENOUS | Status: DC
Start: ? — End: 2019-12-10

## 2020-01-01 DIAGNOSIS — Z0120 Encounter for dental examination and cleaning without abnormal findings: Secondary | ICD-10-CM | POA: Diagnosis not present

## 2020-01-16 DIAGNOSIS — H401131 Primary open-angle glaucoma, bilateral, mild stage: Secondary | ICD-10-CM | POA: Diagnosis not present

## 2020-01-16 DIAGNOSIS — H2513 Age-related nuclear cataract, bilateral: Secondary | ICD-10-CM | POA: Diagnosis not present

## 2020-02-24 ENCOUNTER — Other Ambulatory Visit: Payer: Self-pay | Admitting: Physician Assistant

## 2020-03-24 ENCOUNTER — Other Ambulatory Visit: Payer: Self-pay

## 2020-03-24 ENCOUNTER — Encounter: Payer: Self-pay | Admitting: Osteopathic Medicine

## 2020-03-24 ENCOUNTER — Ambulatory Visit (INDEPENDENT_AMBULATORY_CARE_PROVIDER_SITE_OTHER): Payer: Medicare Other | Admitting: Osteopathic Medicine

## 2020-03-24 VITALS — BP 109/61 | HR 75 | Temp 98.1°F | Wt 247.0 lb

## 2020-03-24 DIAGNOSIS — Z125 Encounter for screening for malignant neoplasm of prostate: Secondary | ICD-10-CM

## 2020-03-24 DIAGNOSIS — Z0000 Encounter for general adult medical examination without abnormal findings: Secondary | ICD-10-CM

## 2020-03-24 DIAGNOSIS — E1069 Type 1 diabetes mellitus with other specified complication: Secondary | ICD-10-CM | POA: Diagnosis not present

## 2020-03-24 DIAGNOSIS — M109 Gout, unspecified: Secondary | ICD-10-CM

## 2020-03-24 DIAGNOSIS — Z23 Encounter for immunization: Secondary | ICD-10-CM | POA: Diagnosis not present

## 2020-03-24 DIAGNOSIS — Z905 Acquired absence of kidney: Secondary | ICD-10-CM | POA: Diagnosis not present

## 2020-03-24 DIAGNOSIS — E7800 Pure hypercholesterolemia, unspecified: Secondary | ICD-10-CM

## 2020-03-24 DIAGNOSIS — E349 Endocrine disorder, unspecified: Secondary | ICD-10-CM

## 2020-03-24 DIAGNOSIS — Z8709 Personal history of other diseases of the respiratory system: Secondary | ICD-10-CM

## 2020-03-24 LAB — POCT GLYCOSYLATED HEMOGLOBIN (HGB A1C): Hemoglobin A1C: 7.8 % — AB (ref 4.0–5.6)

## 2020-03-24 MED ORDER — OMEPRAZOLE 20 MG PO CPDR: 20.0000 mg | DELAYED_RELEASE_CAPSULE | Freq: Two times a day (BID) | ORAL | 3 refills | Status: DC

## 2020-03-24 MED ORDER — ATORVASTATIN CALCIUM 40 MG PO TABS: 40.0000 mg | ORAL_TABLET | Freq: Every day | ORAL | 3 refills | Status: DC

## 2020-03-24 MED ORDER — LISINOPRIL 5 MG PO TABS: 5.0000 mg | ORAL_TABLET | Freq: Every day | ORAL | 3 refills | Status: DC

## 2020-03-24 NOTE — Progress Notes (Signed)
William Bean is a 64 y.o. male who presents to  [place] at [facility]  today, 03/24/20, seeking care for the following: . Annual physical, labs, refill Rx  . DM: following w/ endocrinology but due for A1C, today is 7.8 . Concern w/ ED, needs T levels checked      ASSESSMENT & PLAN with other pertinent history/findings:  The primary encounter diagnosis was Annual physical exam. Diagnoses of Type 1 diabetes mellitus with other specified complication ([place]), Screening for prostate cancer, Single kidney, Pure hypercholesterolemia, History of asthma, Gout, unspecified cause, unspecified chronicity, unspecified site, Testosterone deficiency, and Need for pneumococcal vaccination were also pertinent to this visit.   Immunization History  Administered Date(s) Administered  . DTaP 04/10/2010  . Fluad Quad(high Dose 65+) 07/02/2019  . Influenza,inj,Quad PF,6+ Mos 08/12/2014, 09/24/2015, 09/16/2016, 12/27/2017, 09/18/2018  . Influenza-Unspecified 09/08/2009  . PFIZER SARS-COV-2 Vaccination 11/23/2019, 12/14/2019  . Pneumococcal Polysaccharide-23 01/24/2014, 03/24/2020  . Td 04/16/2017  . Tdap 01/24/2014      Patient Instructions  General Preventive Care  Most recent routine screening labs: ordered today.   Blood pressure: Goal 130/80 or less.   Tobacco: don't!   Alcohol: responsible moderation is ok for most adults - if you have concerns about your alcohol intake, please talk to me!   Exercise: as tolerated to reduce risk of cardiovascular disease and worsening diabetes. Strength training will also prevent osteoporosis.   Mental health: if need for mental health care (medicines, counseling, other), or concerns about moods, please let me know!   Sexual health: if need for STD testing, or if concerns with libido/pain problems, please let me know!   Advanced Directive: Living Will and/or Healthcare Power of Attorney recommended for  all adults, regardless of age or health.  Vaccines  Flu vaccine: for almost everyone, every fall.   Shingles vaccine: after age 20. Medicare will not pay for it in the doctor's office, please ask your pharmacy!  Pneumonia vaccines: for diabetics, once prior to age 51, then booster after age 1 / 28 or more years after first dose. You're due!   Tetanus booster: every 10 years, due 2028.   COVID vaccine: thanks for getting your vaccine!   Cancer screenings   Colon cancer screening: Colonoscopy due 09/2022  Prostate cancer screening: PSA blood test annually age 66-71  Lung cancer screening: not needed if never smoker.  Infection screenings  . HIV: recommended screening at least once age 33-65, more often as needed. . Gonorrhea/Chlamydia: screening as needed. . Hepatitis C: recommended once for everyone age 79-75 . TB: certain at-risk populations, or depending on work requirements and/or travel history Other . Bone Density Test: recommended for men at age 73 . Abdominal Aortic Aneurysm: not needed if never smoker    Orders Placed This Encounter  Procedures  . Pneumococcal polysaccharide vaccine 23-valent greater than or equal to 2yo subcutaneous/IM  . CBC  . COMPLETE METABOLIC PANEL WITH GFR  . Lipid panel  . PSA, Total with Reflex to PSA, Free  . Uric acid  . Testosterone  . POCT HgB A1C    Meds ordered this encounter  Medications  . atorvastatin (LIPITOR) 40 MG tablet    Sig: Take 1 tablet (40 mg total) by mouth daily.    Dispense:  90 tablet    Refill:  3  . lisinopril (ZESTRIL) 5 MG tablet    Sig: Take 1 tablet (5 mg total) by mouth daily.    Dispense:  90 tablet    Refill:  3  . omeprazole (PRILOSEC) 20 MG capsule    Sig: Take 1 capsule (20 mg total) by mouth 2 (two) times daily before a meal.    Dispense:  180 capsule    Refill:  3   Constitutional:  . VSS, see nurse notes . General Appearance: alert, well-developed, well-nourished, NAD Eyes: Marland Kitchen Normal  lids and conjunctive Neck: . No masses, trachea midline . No thyroid enlargement/tenderness/mass appreciated Respiratory: . Normal respiratory effort . Breath sounds normal, no wheeze/rhonchi/rales Cardiovascular: . S1/S2 normal, no murmur/rub/gallop auscultated . No lower extremity edema Gastrointestinal: . Nontender, no masses . No hepatomegaly, no splenomegaly . No hernia appreciated Musculoskeletal:  . Gait normal . No clubbing/cyanosis of digits Neurological: . No cranial nerve deficit on limited exam . Motor and sensation intact and symmetric Psychiatric: . Normal judgment/insight . Normal mood and affect     Follow-up instructions: Return in about 1 year (around 03/24/2021) for [place] (call week prior to visit for lab orders), sooner if needed. I think as long as A1C / BP are ok w/ endocrine, can see [place] annually, more often as needed.                                          BP 109/61 (BP Location: Left Arm, Patient Position: Sitting, Cuff Size: Normal)   Pulse 75   Temp 98.1 F (36.7 C) (Oral)   Wt 247 lb (112 kg)   BMI 32.59 kg/m   Current Meds  Medication Sig  . AMBULATORY NON FORMULARY MEDICATION Glucometer: Verio One Touch.  Use to check blood sugar up to three times a day.  Marland Kitchen aspirin 81 MG tablet Take 81 mg by mouth daily.  Marland Kitchen atorvastatin (LIPITOR) 40 MG tablet Take 1 tablet (40 mg total) by mouth daily.  Marland Kitchen co-enzyme Q-10 30 MG capsule Take 30 mg by mouth daily.  Marland Kitchen glucose 4 GM chewable tablet Chew 1 tablet (4 g total) by mouth as needed for low blood sugar.  Marland Kitchen glucose blood (ONETOUCH VERIO) test strip 1 each by Other route 2 (two) times daily. And lancets 2/day 250.01  . insulin NPH Human (HUMULIN N,NOVOLIN N) 100 UNIT/ML injection Inject 42 Units into the skin 2 (two) times daily.   Marland Kitchen lisinopril (ZESTRIL) 5 MG tablet Take 1 tablet (5 mg total) by mouth daily.  . Multiple Vitamin (MULTIVITAMIN) tablet Take 1 tablet by  mouth daily.   . Omega-3 Fatty Acids (FISH OIL) 500 MG CAPS Take 2 tablets by mouth daily.   Marland Kitchen omeprazole (PRILOSEC) 20 MG capsule Take 1 capsule (20 mg total) by mouth 2 (two) times daily before a meal.  . Turmeric (QC TUMERIC COMPLEX) 500 MG CAPS Take by mouth daily.  . [DISCONTINUED] atorvastatin (LIPITOR) 40 MG tablet Take 1 tablet (40 mg total) by mouth daily. NEEDS LABS  . [DISCONTINUED] lisinopril (ZESTRIL) 5 MG tablet Take 1 tablet (5 mg total) by mouth daily. PLEASE SCHEDULE AN APPOINTMENT FOR REFILLS  . [DISCONTINUED] omeprazole (PRILOSEC) 20 MG capsule Take 1 capsule (20 mg total) by mouth 2 (two) times daily before a meal.    Results for orders placed or performed in visit on 03/24/20 (from the past 72 hour(s))  POCT HgB A1C     Status: Abnormal   Collection Time: 03/24/20 10:51 AM  Result Value Ref Range   Hemoglobin  A1C 7.8 (A) 4.0 - 5.6 %   HbA1c POC (<> result, manual entry)     HbA1c, POC (prediabetic range)     HbA1c, POC (controlled diabetic range)      No results found.  Depression screen [facility] 2/9 07/02/2019 09/18/2018 03/27/2018  Decreased Interest 0 0 0  Down, Depressed, Hopeless 0 0 0  PHQ - 2 Score 0 0 0  Altered sleeping 2 0 0  Tired, decreased energy 2 1 0  Change in appetite 0 0 0  Feeling bad or failure about yourself  0 0 0  Trouble concentrating 3 0 0  Moving slowly or fidgety/restless 1 0 0  Suicidal thoughts 0 0 0  PHQ-9 Score 8 1 0  Difficult doing work/chores Somewhat difficult Not difficult at all -    GAD 7 : Generalized Anxiety Score 07/02/2019 09/18/2018 03/27/2018  Nervous, Anxious, on Edge 1 0 0  Control/stop worrying 0 0 0  Worry too much - different things 0 0 0  Trouble relaxing 0 0 0  Restless 0 0 0  Easily annoyed or irritable 1 0 1  Afraid - awful might happen 0 0 0  Total GAD 7 Score 2 0 1  Anxiety Difficulty Not difficult at all Not difficult at all Not difficult at all      All questions at time of visit were answered -  patient instructed to contact office with any additional concerns or updates.  ER/RTC precautions were reviewed with the patient.  Please note: voice recognition software was used to produce this document, and typos may escape review. Please contact Dr. Sheppard Coil for any needed clarifications.

## 2020-03-24 NOTE — Patient Instructions (Addendum)
General Preventive Care  Most recent routine screening labs: ordered today.   Blood pressure: Goal 130/80 or less.   Tobacco: don't!   Alcohol: responsible moderation is ok for most adults - if you have concerns about your alcohol intake, please talk to me!   Exercise: as tolerated to reduce risk of cardiovascular disease and worsening diabetes. Strength training will also prevent osteoporosis.   Mental health: if need for mental health care (medicines, counseling, other), or concerns about moods, please let me know!   Sexual health: if need for STD testing, or if concerns with libido/pain problems, please let me know!   Advanced Directive: Living Will and/or Healthcare Power of Attorney recommended for all adults, regardless of age or health.  Vaccines  Flu vaccine: for almost everyone, every fall.   Shingles vaccine: after age 66. Medicare will not pay for it in the doctor's office, please ask your pharmacy!  Pneumonia vaccines: for diabetics, once prior to age 17, then booster after age 48 / 28 or more years after first dose. You're due!   Tetanus booster: every 10 years, due 2028.   COVID vaccine: thanks for getting your vaccine!   Cancer screenings   Colon cancer screening: Colonoscopy due 09/2022  Prostate cancer screening: PSA blood test annually age 88-71  Lung cancer screening: not needed if never smoker.  Infection screenings  . HIV: recommended screening at least once age 70-65, more often as needed. . Gonorrhea/Chlamydia: screening as needed. . Hepatitis C: recommended once for everyone age 60-75 . TB: certain at-risk populations, or depending on work requirements and/or travel history Other . Bone Density Test: recommended for men at age 71 . Abdominal Aortic Aneurysm: not needed if never smoker

## 2020-03-31 ENCOUNTER — Ambulatory Visit: Payer: Medicare Other | Admitting: Nurse Practitioner

## 2020-04-14 DIAGNOSIS — E7800 Pure hypercholesterolemia, unspecified: Secondary | ICD-10-CM | POA: Diagnosis not present

## 2020-04-14 DIAGNOSIS — E349 Endocrine disorder, unspecified: Secondary | ICD-10-CM | POA: Diagnosis not present

## 2020-04-14 DIAGNOSIS — M109 Gout, unspecified: Secondary | ICD-10-CM | POA: Diagnosis not present

## 2020-04-14 DIAGNOSIS — Z0000 Encounter for general adult medical examination without abnormal findings: Secondary | ICD-10-CM | POA: Diagnosis not present

## 2020-04-14 DIAGNOSIS — E1069 Type 1 diabetes mellitus with other specified complication: Secondary | ICD-10-CM | POA: Diagnosis not present

## 2020-04-15 LAB — CBC
HCT: 43.3 % (ref 38.5–50.0)
Hemoglobin: 14.7 g/dL (ref 13.2–17.1)
MCH: 29.4 pg (ref 27.0–33.0)
MCHC: 33.9 g/dL (ref 32.0–36.0)
MCV: 86.6 fL (ref 80.0–100.0)
MPV: 11.1 fL (ref 7.5–12.5)
Platelets: 163 10*3/uL (ref 140–400)
RBC: 5 10*6/uL (ref 4.20–5.80)
RDW: 13.5 % (ref 11.0–15.0)
WBC: 6 10*3/uL (ref 3.8–10.8)

## 2020-04-15 LAB — LIPID PANEL
Cholesterol: 115 mg/dL (ref <200)
HDL: 48 mg/dL (ref >=40)
LDL Cholesterol (Calc): 50 mg/dL (calc)
Non-HDL Cholesterol (Calc): 67 mg/dL (calc) (ref <130)
Total CHOL/HDL Ratio: 2.4 (calc) (ref <5.0)
Triglycerides: 87 mg/dL (ref <150)

## 2020-04-15 LAB — COMPLETE METABOLIC PANEL WITH GFR
AG Ratio: 1.6 (calc) (ref 1.0–2.5)
ALT: 39 U/L (ref 9–46)
AST: 22 U/L (ref 10–35)
Albumin: 4.5 g/dL (ref 3.6–5.1)
Alkaline phosphatase (APISO): 97 U/L (ref 35–144)
BUN: 17 mg/dL (ref 7–25)
CO2: 24 mmol/L (ref 20–32)
Calcium: 10.1 mg/dL (ref 8.6–10.3)
Chloride: 101 mmol/L (ref 98–110)
Creat: 1.04 mg/dL (ref 0.70–1.25)
GFR, Est African American: 87 mL/min/{1.73_m2} (ref >=60)
GFR, Est Non African American: 75 mL/min/{1.73_m2} (ref >=60)
Globulin: 2.8 g/dL (calc) (ref 1.9–3.7)
Glucose, Bld: 187 mg/dL — ABNORMAL HIGH (ref 65–99)
Potassium: 4.2 mmol/L (ref 3.5–5.3)
Sodium: 140 mmol/L (ref 135–146)
Total Bilirubin: 0.8 mg/dL (ref 0.2–1.2)
Total Protein: 7.3 g/dL (ref 6.1–8.1)

## 2020-04-15 LAB — TESTOSTERONE: Testosterone: 445 ng/dL (ref 250–827)

## 2020-04-15 LAB — URIC ACID: Uric Acid, Serum: 6.6 mg/dL (ref 4.0–8.0)

## 2020-04-15 LAB — PSA, TOTAL WITH REFLEX TO PSA, FREE: PSA, Total: 0.2 ng/mL (ref <=4.0)

## 2020-04-17 ENCOUNTER — Other Ambulatory Visit: Payer: Self-pay

## 2020-04-17 DIAGNOSIS — M109 Gout, unspecified: Secondary | ICD-10-CM

## 2020-04-17 MED ORDER — ALLOPURINOL 100 MG PO TABS: 100.0000 mg | ORAL_TABLET | Freq: Every day | ORAL | 3 refills | Status: DC | PRN

## 2020-04-21 ENCOUNTER — Telehealth: Payer: Self-pay | Admitting: Gastroenterology

## 2020-04-21 NOTE — Telephone Encounter (Signed)
LMOM for patient to call back.

## 2020-04-22 NOTE — Telephone Encounter (Signed)
Spoke to patient who has a follow up appointment scheduled for 06/10/20 with Dr Bryan Lemma. He will take OTC Omeprazole 20 mg as needed and OTC Pepcid at bedtime as needed until he follow up appointment. All questions answered. Patient voiced understanding.

## 2020-05-20 DIAGNOSIS — E109 Type 1 diabetes mellitus without complications: Secondary | ICD-10-CM | POA: Diagnosis not present

## 2020-05-20 DIAGNOSIS — H34231 Retinal artery branch occlusion, right eye: Secondary | ICD-10-CM | POA: Diagnosis not present

## 2020-05-20 DIAGNOSIS — I10 Essential (primary) hypertension: Secondary | ICD-10-CM | POA: Insufficient documentation

## 2020-05-22 DIAGNOSIS — H2513 Age-related nuclear cataract, bilateral: Secondary | ICD-10-CM | POA: Diagnosis not present

## 2020-05-22 DIAGNOSIS — H401131 Primary open-angle glaucoma, bilateral, mild stage: Secondary | ICD-10-CM | POA: Diagnosis not present

## 2020-05-22 DIAGNOSIS — H401130 Primary open-angle glaucoma, bilateral, stage unspecified: Secondary | ICD-10-CM | POA: Insufficient documentation

## 2020-05-22 DIAGNOSIS — H34231 Retinal artery branch occlusion, right eye: Secondary | ICD-10-CM | POA: Diagnosis not present

## 2020-05-27 ENCOUNTER — Ambulatory Visit: Payer: Medicare Other | Admitting: Gastroenterology

## 2020-06-10 ENCOUNTER — Telehealth: Payer: Self-pay | Admitting: Gastroenterology

## 2020-06-10 ENCOUNTER — Ambulatory Visit: Payer: Medicare Other | Admitting: Gastroenterology

## 2020-06-10 NOTE — Telephone Encounter (Signed)
Understood. Given the missed appointment today, can proceed for the following as a way to expedite his work-up, and if normal, can pick up his evaluation as scheduled later this month:  -Check liver enzymes and CBC -RUQ [place] to evaluate for gallstones

## 2020-06-10 NOTE — Telephone Encounter (Signed)
Patient had an appointment today with Dr Bryan Lemma. His appointment reminder confirmed him to go to [place]. Patient arrived at 11:20 and was told he was at the wrong office. They told him that they did not switch the machine recording to the right office and rescheduled him to see Anderson Malta on 07/02/20. Patient is having abdominal pain along with mid back and shoulder pain. These "attacks" have been accruing more frequently. He had an appointment back in May and July to discuss abdominal pain pain and US,but canceled due to family issues. Patient would like to have an UA ASAP as he feels his gall bladder is causing his pain. Please advise.

## 2020-06-11 ENCOUNTER — Other Ambulatory Visit: Payer: Self-pay | Admitting: Gastroenterology

## 2020-06-11 DIAGNOSIS — R1011 Right upper quadrant pain: Secondary | ICD-10-CM

## 2020-06-11 NOTE — Telephone Encounter (Signed)
Spoke to patient to inform him that Dr Bryan Lemma has ordered him to have a RUQ [place] to evaluate for gallstones and check blood work. Appointment has been made for 06/12/20 at [facility] imaging. He will have labs done on the same day. Verbalized instructions. Patient was very appreciative and voiced understandin

## 2020-06-12 ENCOUNTER — Ambulatory Visit (INDEPENDENT_AMBULATORY_CARE_PROVIDER_SITE_OTHER): Payer: Medicare Other

## 2020-06-12 ENCOUNTER — Other Ambulatory Visit: Payer: Self-pay

## 2020-06-12 ENCOUNTER — Ambulatory Visit (HOSPITAL_BASED_OUTPATIENT_CLINIC_OR_DEPARTMENT_OTHER): Payer: Medicare Other

## 2020-06-12 ENCOUNTER — Other Ambulatory Visit: Payer: Self-pay | Admitting: Gastroenterology

## 2020-06-12 DIAGNOSIS — R1011 Right upper quadrant pain: Secondary | ICD-10-CM | POA: Diagnosis not present

## 2020-06-12 DIAGNOSIS — K760 Fatty (change of) liver, not elsewhere classified: Secondary | ICD-10-CM | POA: Diagnosis not present

## 2020-06-12 LAB — CBC WITH DIFFERENTIAL/PLATELET
Absolute Monocytes: 366 cells/uL (ref 200–950)
Basophils Absolute: 41 cells/uL (ref 0–200)
Basophils Relative: 0.6 %
Eosinophils Absolute: 159 cells/uL (ref 15–500)
Eosinophils Relative: 2.3 %
HCT: 43.7 % (ref 38.5–50.0)
Hemoglobin: 14.8 g/dL (ref 13.2–17.1)
Lymphs Abs: 1994 cells/uL (ref 850–3900)
MCH: 29.4 pg (ref 27.0–33.0)
MCHC: 33.9 g/dL (ref 32.0–36.0)
MCV: 86.7 fL (ref 80.0–100.0)
MPV: 10.9 fL (ref 7.5–12.5)
Monocytes Relative: 5.3 %
Neutro Abs: 4340 cells/uL (ref 1500–7800)
Neutrophils Relative %: 62.9 %
Platelets: 167 10*3/uL (ref 140–400)
RBC: 5.04 10*6/uL (ref 4.20–5.80)
RDW: 13.4 % (ref 11.0–15.0)
Total Lymphocyte: 28.9 %
WBC: 6.9 10*3/uL (ref 3.8–10.8)

## 2020-06-12 LAB — HEPATIC FUNCTION PANEL
AG Ratio: 1.8 (calc) (ref 1.0–2.5)
ALT: 22 U/L (ref 9–46)
AST: 16 U/L (ref 10–35)
Albumin: 4.5 g/dL (ref 3.6–5.1)
Alkaline phosphatase (APISO): 103 U/L (ref 35–144)
Bilirubin, Direct: 0.2 mg/dL (ref 0.0–0.2)
Globulin: 2.5 g/dL (calc) (ref 1.9–3.7)
Indirect Bilirubin: 0.6 mg/dL (calc) (ref 0.2–1.2)
Total Bilirubin: 0.8 mg/dL (ref 0.2–1.2)
Total Protein: 7 g/dL (ref 6.1–8.1)

## 2020-06-12 NOTE — Progress Notes (Signed)
cbc

## 2020-06-27 ENCOUNTER — Telehealth: Payer: Self-pay

## 2020-06-27 NOTE — Telephone Encounter (Signed)
Nope, nothing in my inbasket unless this was a PA that didn't come to me

## 2020-06-27 NOTE — Telephone Encounter (Signed)
[name] called to find out if the paperwork for the William Bean has been received.

## 2020-07-02 ENCOUNTER — Ambulatory Visit: Payer: Medicare Other | Admitting: Physician Assistant

## 2020-07-07 ENCOUNTER — Ambulatory Visit: Payer: Medicare Other | Admitting: Physician Assistant

## 2020-08-19 ENCOUNTER — Ambulatory Visit: Payer: Medicare Other | Admitting: Physician Assistant

## 2020-08-19 NOTE — Progress Notes (Signed)
08/19/2020 William Bean [id number] [birth year]    Chief Complaint: chest pain   History of Present Illness: William Bean is a 64 year old male with a past medical history of diabetes, hypertension, hyperlipidemia, asthma, gout, and pyloric stenosis. He was last seen in our office by Dr. Cathleen Corti on 08/10/2019 for further evaluation for episodic epigastric pain. He underwent an EGD and colonoscopy on  09/10/2019. The EGD showed a normal esophagus, gastritis and a normal duodenum. The colonoscopy showed tubular adenomatous/polypoid/hyperplastic changes. He denies having any heartburn or dysphagia. He is on Omeprazole 20mg  po bid. He is passing a normal formed brown BM most days. No rectal bleeding or melena. He stated he was diagnosed with Crohn's disease in 1989, never treated. Further details are unclear. 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 His remaining kidney with normal function.   He presents today for further follow up regarding episodes of chest pain. He reports having episodes of mid chest/esophageal pain "a dot of pain in my chest" which started 3 1/2 years ago. He describes the pain as sharp and knife like. He can't think or doing anything during these episodes. He is in constant motion, in agony which is horrific and lasts for 4 to 5 hours. No associated SOB, palpitations or dizziness. He becomes physically exhausted then eventually falls asleep. No associated N/V. He awakens the next morning and his chest pain is gone. These episodes occur twice yearly, however, about 9 months ago he had one episode monthly x 3. His last episode occurred 5 or 6 months ago. The last 2 episodes of chest pain were accompanied by a sharp pain between the shoulder blades. No obvious food or stress triggers. He underwent a negative nuclear stress test 01/25/2018 as ordered by cardiologist Dr. Stanford Breed which was normal, low risk study, no perfusion defects or ischemia. LV EF 66%. He has elevated  cholesterol. History of DM II. Father with history of CAD and alcoholism.   He had an appointment to see Dr. Bryan Lemma 06/10/2020  But he missed this appointment as he went to the wrong office. A RUQ sonogram was ordered to further evaluate the gallbladder. The abdominal sonogram was done 06/12/2020 which showed a normal gallbladder and CBD. Fatty liver was noted.   CMP Latest Ref Rng & Units 06/12/2020 04/14/2020 06/29/2019  Glucose 65 - 99 mg/dL - 187(H) 356(H)  BUN 7 - 25 mg/dL - 17 17  Creatinine 0.70 - 1.25 mg/dL - 1.04 1.04  Sodium 135 - 146 mmol/L - 140 137  Potassium 3.5 - 5.3 mmol/L - 4.2 5.1  Chloride 98 - 110 mmol/L - 101 99  CO2 20 - 32 mmol/L - 24 31  Calcium 8.6 - 10.3 mg/dL - 10.1 9.6  Total Protein 6.1 - 8.1 g/dL 7.0 7.3 6.5  Total Bilirubin 0.2 - 1.2 mg/dL 0.8 0.8 0.8  Alkaline Phos 40 - 115 U/L - - -  AST 10 - 35 U/L 16 22 15   ALT 9 - 46 U/L 22 39 22    CBC Latest Ref Rng & Units 08/20/2020 06/12/2020 04/14/2020  WBC 4.0 - 10.5 K/uL 8.3 6.9 6.0  Hemoglobin 13.0 - 17.0 g/dL 14.4 14.8 14.7  Hematocrit 39 - 52 % 42.0 43.7 43.3  Platelets 150 - 400 K/uL 162.0 167 163    RUQ sonogram 06/12/2020:  Gallbladder: No gallstones or wall thickening visualized. No sonographic Murphy sign noted by sonographer. Common bile duct: Diameter: 3.3 mm. Liver: Diffuse increased echogenicity is  noted without focal mass. This is most consistent with fatty infiltration. Portal vein is patent on color Doppler imaging with normal direction of blood flow towards the liver. IMPRESSION: No acute abnormality noted. Fatty liver.  EGD 09/10/2019:  - Normal esophagus. - Z-line regular, 40 cm from the incisors. - Gastritis. Biopsied. - Normal duodenal bulb, first portion of the duodenum and second portion of the duodenum. Biopsied.  Colonoscopy 09/10/2019: - Three 1 to 2 mm polyps in the sigmoid colon, removed with a cold biopsy forceps. Resected and retrieved. - Non-bleeding internal  hemorrhoids. - Normal mucosa in the entire examined colon. Biopsied. - The examined portion of the ileum was normal.  1. Surgical [P], duodenal BX - DUODENAL MUCOSA WITH NO SPECIFIC HISTOPATHOLOGIC CHANGES - NEGATIVE FOR INCREASED INTRAEPITHELIAL LYMPHOCYTES OR VILLOUS ARCHITECTURAL CHANGES 2. Surgical [P], gastric BX - GASTRIC ANTRAL AND OXYNTIC MUCOSA WITH NO SPECIFIC HISTOPATHOLOGIC CHANGES - WARTHIN STARRY STAIN IS NEGATIVE FOR HELICOBACTER PYLORI 3. Surgical [P], right colon BX - COLONIC MUCOSA WITH NO SPECIFIC HISTOPATHOLOGIC CHANGES - NEGATIVE FOR ACUTE INFLAMMATION, INCREASED INTRAEPITHELIAL LYMPHOCYTES OR THICKENED SUBEPITHELIAL COLLAGEN TABLE 4. Surgical [P], left colon BX - COLONIC MUCOSA WITH NO SPECIFIC HISTOPATHOLOGIC CHANGES - NEGATIVE FOR ACUTE INFLAMMATION, INCREASED INTRAEPITHELIAL LYMPHOCYTES OR THICKENED SUBEPITHELIAL COLLAGEN TABLE 5. Surgical [P], colon, sigmoid, polyp (3) - TUBULAR ADENOMA WITHOUT HIGH-GRADE DYSPLASIA OR MALIGNANCY - FRAGMENT OF POLYPOID COLONIC MUCOSA WITH MILD HYPERPLASTIC CHANGES - FRAGMENT OF POLYPOID COLONIC MUCOSA WITH A PROMINENT LYMPHOID AGGREGATE  Current Outpatient Medications on File Prior to Visit  Medication Sig Dispense Refill  . albuterol (PROVENTIL HFA;VENTOLIN HFA) 108 (90 Base) MCG/ACT inhaler Inhale two puffs every 4-6 hours only as needed for shortness of breath or wheezing. 1 Inhaler 1  . allopurinol (ZYLOPRIM) 100 MG tablet Take 1 tablet (100 mg total) by mouth daily as needed (As Needed). 90 tablet 3  . AMBULATORY NON FORMULARY MEDICATION Glucometer: Verio One Touch.  Use to check blood sugar up to three times a day. 1 Units 0  . aspirin 81 MG tablet Take 81 mg by mouth daily.    Marland Kitchen atorvastatin (LIPITOR) 40 MG tablet Take 1 tablet (40 mg total) by mouth daily. 90 tablet 3  . co-enzyme Q-10 30 MG capsule Take 30 mg by mouth daily.    Marland Kitchen glucose 4 GM chewable tablet Chew 1 tablet (4 g total) by mouth as needed for low blood  sugar. 50 tablet 12  . glucose blood (ONETOUCH VERIO) test strip 1 each by Other route 2 (two) times daily. And lancets 2/day 250.01 100 each 12  . insulin NPH Human (HUMULIN N,NOVOLIN N) 100 UNIT/ML injection Inject 42 Units into the skin 2 (two) times daily.     Marland Kitchen latanoprost (XALATAN) 0.005 % ophthalmic solution Place 1 drop into both eyes at bedtime.    Marland Kitchen lisinopril (ZESTRIL) 5 MG tablet Take 1 tablet (5 mg total) by mouth daily. 90 tablet 3  . Multiple Vitamin (MULTIVITAMIN) tablet Take 1 tablet by mouth daily.     . Omega-3 Fatty Acids (FISH OIL) 500 MG CAPS Take 2 tablets by mouth daily.     Marland Kitchen omeprazole (PRILOSEC) 20 MG capsule Take 1 capsule (20 mg total) by mouth 2 (two) times daily before a meal. 180 capsule 3  . Turmeric (QC TUMERIC COMPLEX) 500 MG CAPS Take by mouth daily.     No current facility-administered medications on file prior to visit.    Allergies  Allergen Reactions  . Penicillins Other (See  Comments)    Unknown reaction As a baby        Current Medications, Allergies, Past Medical History, Past Surgical History, Family History and Social History were reviewed in [employer] record.   Review of Systems:   Constitutional: Negative for fever, sweats, chills or weight loss.  Respiratory: Negative for shortness of breath.   Cardiovascular: See HPI.  Gastrointestinal: See HPI.  Musculoskeletal: Negative for back pain or muscle aches.  Neurological: Negative for dizziness, headaches or paresthesias.    Physical Exam: BP (!) 124/58 (BP Location: Left Arm, Patient Position: Sitting, Cuff Size: Normal)   Pulse 84   Ht 5' 11.5" (1.816 m) Comment: height measured without shoes  Wt 245 lb 2 oz (111.2 kg)   BMI 33.71 kg/m   General: Well developed 64 year old male in no acute distress. Head: Normocephalic and atraumatic. Eyes: No scleral icterus. Conjunctiva pink . Ears: Normal auditory acuity. Mouth: Dentition intact. No ulcers or  lesions.  Lungs: Clear throughout to auscultation. Heart: Regular rate and rhythm, no murmur. Abdomen: Soft, nontender and nondistended. No masses or hepatomegaly. Normal bowel sounds x 4 quadrants. Extensive upper abdominal scar intact.  Rectal: Deferred.  Musculoskeletal: Symmetrical with no gross deformities. Extremities: No edema. Neurological: Alert oriented x 4. No focal deficits.  Psychological: Alert and cooperative. Normal mood and affect  Assessment and Recommendations:  69. 64 year old male with atypical chest pain which radiates between the shoulder blades x the last 2 episodes which last occurred 5 to 6 months ago which is suggestive of b biliary etiology. EGD 09/2010 showed a normal esophagus with mild gastritis. Nuclear stress test in 2019 was considered a normal study.  -CBC, CMP -CCK HIDA -Patient to call our office if CP/pain between the shoulder blades recur -Recommended cardiology follow up if CP recurs  -Continue Omeprazole as previously prescribed for now -Hyoscyamine 0.125mg  on tab SL Q 6 Hrs PRN esophageal pain -To the ED if severe CP occurs   2. Fatty liver. Normal LFTs. -Discussed risk of fatty liver disease/cirrhosis  -Recommended 10 lb weight loss.  -Check LFTs Q 6 to 12 months with PCP  3. History of colon polyps. -Nex colonoscopy due 09/2022

## 2020-08-20 ENCOUNTER — Other Ambulatory Visit (INDEPENDENT_AMBULATORY_CARE_PROVIDER_SITE_OTHER): Payer: Medicare Other

## 2020-08-20 ENCOUNTER — Encounter: Payer: Self-pay | Admitting: Nurse Practitioner

## 2020-08-20 ENCOUNTER — Ambulatory Visit: Payer: Medicare Other | Admitting: Nurse Practitioner

## 2020-08-20 VITALS — BP 124/58 | HR 84 | Ht 71.5 in | Wt 245.1 lb

## 2020-08-20 DIAGNOSIS — E119 Type 2 diabetes mellitus without complications: Secondary | ICD-10-CM | POA: Diagnosis not present

## 2020-08-20 DIAGNOSIS — K8050 Calculus of bile duct without cholangitis or cholecystitis without obstruction: Secondary | ICD-10-CM

## 2020-08-20 DIAGNOSIS — R079 Chest pain, unspecified: Secondary | ICD-10-CM

## 2020-08-20 LAB — CBC WITH DIFFERENTIAL/PLATELET
Basophils Absolute: 0.1 10*3/uL (ref 0.0–0.1)
Basophils Relative: 1 % (ref 0.0–3.0)
Eosinophils Absolute: 0.3 10*3/uL (ref 0.0–0.7)
Eosinophils Relative: 3 % (ref 0.0–5.0)
HCT: 42 % (ref 39.0–52.0)
Hemoglobin: 14.4 g/dL (ref 13.0–17.0)
Lymphocytes Relative: 26.7 % (ref 12.0–46.0)
Lymphs Abs: 2.2 10*3/uL (ref 0.7–4.0)
MCHC: 34.2 g/dL (ref 30.0–36.0)
MCV: 86 fl (ref 78.0–100.0)
Monocytes Absolute: 0.4 10*3/uL (ref 0.1–1.0)
Monocytes Relative: 5.2 % (ref 3.0–12.0)
Neutro Abs: 5.3 10*3/uL (ref 1.4–7.7)
Neutrophils Relative %: 64.1 % (ref 43.0–77.0)
Platelets: 162 10*3/uL (ref 150.0–400.0)
RBC: 4.88 Mil/uL (ref 4.22–5.81)
RDW: 13.9 % (ref 11.5–15.5)
WBC: 8.3 10*3/uL (ref 4.0–10.5)

## 2020-08-20 MED ORDER — HYOSCYAMINE SULFATE 0.125 MG SL SUBL
0.1250 mg | SUBLINGUAL_TABLET | Freq: Four times a day (QID) | SUBLINGUAL | 0 refills | Status: DC | PRN
Start: 2020-08-20 — End: 2021-05-14

## 2020-08-20 NOTE — Patient Instructions (Addendum)
If you are age 66 or older, your body mass index should be between 23-30. Your Body mass index is 33.71 kg/m. If this is out of the aforementioned range listed, please consider follow up with your Primary Care Provider.  If you are age 28 or younger, your body mass index should be between 19-25. Your Body mass index is 33.71 kg/m. If this is out of the aformentioned range listed, please consider follow up with your Primary Care Provider.   Your provider has requested that you go to the basement level for lab work before leaving today. Press "B" on the elevator. The lab is located at the first door on the left as you exit the elevator.  You have been scheduled for a HIDA scan at [facility] (1st floor) on ________. Please arrive 15 minutes prior to your scheduled appointment at  _______. Make certain not to have anything to eat or drink at least 6 hours prior to your test. Should this appointment date or time not work well for you, please call radiology scheduling at [phone].  _____________________________________________________________________ hepatobiliary (HIDA) scan is an imaging procedure used to diagnose problems in the liver, gallbladder and bile ducts. In the HIDA scan, a radioactive chemical or tracer is injected into a vein in your arm. The tracer is handled by the liver like bile. Bile is a fluid produced and excreted by your liver that helps your digestive system break down fats in the foods you eat. Bile is stored in your gallbladder and the gallbladder releases the bile when you eat a meal. A special nuclear medicine scanner (gamma camera) tracks the flow of the tracer from your liver into your gallbladder and small intestine.  During your HIDA scan  You'll be asked to change into a hospital gown before your HIDA scan begins. Your health care team will position you on a table, usually on your back. The radioactive tracer is then injected into a vein in your arm.The tracer  travels through your bloodstream to your liver, where it's taken up by the bile-producing cells. The radioactive tracer travels with the bile from your liver into your gallbladder and through your bile ducts to your small intestine.You may feel some pressure while the radioactive tracer is injected into your vein. As you lie on the table, a special gamma camera is positioned over your abdomen taking pictures of the tracer as it moves through your body. The gamma camera takes pictures continually for about an hour. You'll need to keep still during the HIDA scan. This can become uncomfortable, but you may find that you can lessen the discomfort by taking deep breaths and thinking about other things. Tell your health care team if you're uncomfortable. The radiologist will watch on a computer the progress of the radioactive tracer through your body. The HIDA scan may be stopped when the radioactive tracer is seen in the gallbladder and enters your small intestine. This typically takes about an hour. In some cases extra imaging will be performed if original images aren't satisfactory, if morphine is given to help visualize the gallbladder or if the medication CCK is given to look at the contraction of the gallbladder. This test typically takes 2 hours to complete. ________________________________________________________________________   START Hyoscyamine 1 tablet every 6 hours as needed under the tongue for Esophageal pain.  Go to the ED if you have any severe Chest pain  Follow up with  Cardiology if chest pain worsens.

## 2020-08-21 NOTE — Addendum Note (Signed)
Addended by: Aleatha Borer on: 08/21/2020 04:03 PM   Modules accepted: Orders

## 2020-08-25 NOTE — Progress Notes (Signed)
Agree with the assessment and plan as outlined by Carl Best, NP. Agree with work up for potential biliary etiology. If unrevealing and sxs continue to recur, can also send for Esophageal Manometry. Continue BID PPI for now.    Gerrit Heck, DO, [facility]

## 2020-08-27 ENCOUNTER — Emergency Department (HOSPITAL_COMMUNITY)
Admission: EM | Admit: 2020-08-27 | Discharge: 2020-08-27 | Disposition: A | Discharge status: Home / Self Care | Payer: Medicare Other | Attending: Emergency Medicine | Admitting: Emergency Medicine

## 2020-08-27 ENCOUNTER — Other Ambulatory Visit: Payer: Self-pay

## 2020-08-27 ENCOUNTER — Emergency Department (HOSPITAL_COMMUNITY): Payer: Medicare Other

## 2020-08-27 ENCOUNTER — Encounter (HOSPITAL_COMMUNITY): Payer: Self-pay

## 2020-08-27 DIAGNOSIS — K219 Gastro-esophageal reflux disease without esophagitis: Secondary | ICD-10-CM | POA: Diagnosis not present

## 2020-08-27 DIAGNOSIS — R079 Chest pain, unspecified: Secondary | ICD-10-CM

## 2020-08-27 DIAGNOSIS — E1122 Type 2 diabetes mellitus with diabetic chronic kidney disease: Secondary | ICD-10-CM | POA: Insufficient documentation

## 2020-08-27 DIAGNOSIS — J986 Disorders of diaphragm: Secondary | ICD-10-CM | POA: Diagnosis not present

## 2020-08-27 DIAGNOSIS — I129 Hypertensive chronic kidney disease with stage 1 through stage 4 chronic kidney disease, or unspecified chronic kidney disease: Secondary | ICD-10-CM | POA: Insufficient documentation

## 2020-08-27 DIAGNOSIS — R1011 Right upper quadrant pain: Secondary | ICD-10-CM

## 2020-08-27 DIAGNOSIS — Z79899 Other long term (current) drug therapy: Secondary | ICD-10-CM | POA: Diagnosis not present

## 2020-08-27 DIAGNOSIS — N181 Chronic kidney disease, stage 1: Secondary | ICD-10-CM | POA: Diagnosis not present

## 2020-08-27 DIAGNOSIS — R0789 Other chest pain: Secondary | ICD-10-CM | POA: Diagnosis not present

## 2020-08-27 DIAGNOSIS — Z794 Long term (current) use of insulin: Secondary | ICD-10-CM | POA: Diagnosis not present

## 2020-08-27 DIAGNOSIS — J45909 Unspecified asthma, uncomplicated: Secondary | ICD-10-CM | POA: Insufficient documentation

## 2020-08-27 DIAGNOSIS — Z7982 Long term (current) use of aspirin: Secondary | ICD-10-CM | POA: Insufficient documentation

## 2020-08-27 DIAGNOSIS — R1013 Epigastric pain: Secondary | ICD-10-CM | POA: Insufficient documentation

## 2020-08-27 DIAGNOSIS — K7689 Other specified diseases of liver: Secondary | ICD-10-CM | POA: Diagnosis not present

## 2020-08-27 LAB — COMPREHENSIVE METABOLIC PANEL
ALT: 26 U/L (ref 0–44)
AST: 20 U/L (ref 15–41)
Albumin: 4.3 g/dL (ref 3.5–5.0)
Alkaline Phosphatase: 94 U/L (ref 38–126)
Anion gap: 11 (ref 5–15)
BUN: 16 mg/dL (ref 8–23)
CO2: 30 mmol/L (ref 22–32)
Calcium: 10 mg/dL (ref 8.9–10.3)
Chloride: 99 mmol/L (ref 98–111)
Creatinine, Ser: 1.27 mg/dL — ABNORMAL HIGH (ref 0.61–1.24)
GFR, Estimated: 58 mL/min — ABNORMAL LOW (ref >60)
Glucose, Bld: 146 mg/dL — ABNORMAL HIGH (ref 70–99)
Potassium: 5.1 mmol/L (ref 3.5–5.1)
Sodium: 140 mmol/L (ref 135–145)
Total Bilirubin: 1 mg/dL (ref 0.3–1.2)
Total Protein: 7.2 g/dL (ref 6.5–8.1)

## 2020-08-27 LAB — URINALYSIS, ROUTINE W REFLEX MICROSCOPIC
Bilirubin Urine: NEGATIVE
Glucose, UA: NEGATIVE mg/dL
Hgb urine dipstick: NEGATIVE
Ketones, ur: NEGATIVE mg/dL
Leukocytes,Ua: NEGATIVE
Nitrite: NEGATIVE
Protein, ur: NEGATIVE mg/dL
Specific Gravity, Urine: 1.011 (ref 1.005–1.030)
pH: 7 (ref 5.0–8.0)

## 2020-08-27 LAB — CBC
HCT: 42.5 % (ref 39.0–52.0)
Hemoglobin: 14.3 g/dL (ref 13.0–17.0)
MCH: 28.8 pg (ref 26.0–34.0)
MCHC: 33.6 g/dL (ref 30.0–36.0)
MCV: 85.7 fL (ref 80.0–100.0)
Platelets: 184 10*3/uL (ref 150–400)
RBC: 4.96 MIL/uL (ref 4.22–5.81)
RDW: 13.2 % (ref 11.5–15.5)
WBC: 9.3 10*3/uL (ref 4.0–10.5)
nRBC: 0 % (ref 0.0–0.2)

## 2020-08-27 LAB — LIPASE, BLOOD: Lipase: 33 U/L (ref 11–51)

## 2020-08-27 LAB — TROPONIN I (HIGH SENSITIVITY)
Troponin I (High Sensitivity): 4 ng/L (ref <18)
Troponin I (High Sensitivity): 4 ng/L (ref <18)

## 2020-08-27 MED ORDER — ONDANSETRON 4 MG PO TBDP
4.0000 mg | ORAL_TABLET | Freq: Once | ORAL | Status: AC
  Administered 2020-08-27: 4 mg via ORAL
  Filled 2020-08-27: qty 1

## 2020-08-27 MED ORDER — OXYCODONE-ACETAMINOPHEN 5-325 MG PO TABS
1.0000 | ORAL_TABLET | Freq: Four times a day (QID) | ORAL | 0 refills | Status: DC | PRN
Start: 2020-08-27 — End: 2021-03-30

## 2020-08-27 MED ORDER — OXYCODONE-ACETAMINOPHEN 5-325 MG PO TABS
2.0000 | ORAL_TABLET | Freq: Once | ORAL | Status: AC
  Administered 2020-08-27: 2 via ORAL
  Filled 2020-08-27: qty 2

## 2020-08-27 NOTE — ED Triage Notes (Signed)
Pt arrives to ED w/ c/o centrally located 10/10 chest pain that started today. Pt endorses n/v, sob.

## 2020-08-27 NOTE — Discharge Instructions (Addendum)
You likely have reflux or esophageal spasm or small gallbladder stones.  Take Nexium as prescribed.  You can take Percocet if you have severe pain.  Please follow-up with your GI doctor and get a HIDA scan as scheduled.  Return to ER if you have worse chest pain, abdominal pain, vomiting

## 2020-08-27 NOTE — ED Notes (Signed)
E-signature pad unavailable at time of pt discharge. This RN discussed discharge materials with pt and answered all pt questions. Pt stated understanding of discharge material. ? ?

## 2020-08-27 NOTE — ED Triage Notes (Signed)
Emergency Medicine Provider Triage Evaluation Note  William Bean , a 64 y.o. male  was evaluated in triage.  Pt complains of abdominal pain and chest pain.  He has had self-limited episodes of the same however they normally last about 4 to 5 hours he has associated vomiting.  This episode has lasted for 2 days.  He complains of severe stabbing epigastric pain, retrosternal pain radiating between his shoulder blades.  He denies fever chills or cough.  He does have nausea but has not yet vomited.  He does feel somewhat short of breath.  He cannot find a comfortable position.  He has seen GI and had work-up and is scheduled for right upper quadrant ultrasound in the next few weeks he is seen by his PCP/GI clinic and is scheduled for right upper quadrant ultrasound in the next few weeks  Review of Systems  Positive: abd pain/ chest pain Negative: fever  Physical Exam  BP (!) 152/82   Pulse 72   Temp 98.4 F (36.9 C) (Oral)   Resp 18   SpO2 98%  Gen:   Awake, no distress, appears uncomfortable  HEENT:  Atraumatic  Resp:  Normal effort  Cardiac:  Normal rate  Abd:   Nondistended, nontender  MSK:   Moves extremities without difficulty  Neuro:  Speech clear   Medical Decision Making  Medically screening exam initiated at 7:50 PM.  Appropriate orders placed.  William Bean was informed that the remainder of the evaluation will be completed by another provider, this initial triage assessment does not replace that evaluation, and the importance of remaining in the ED until their evaluation is complete.  Clinical Impression   Patient here with abdominal pain/chest pain.  I have ordered labs for ACS/belly labs and right upper quadrant abdominal ultrasound.   Margarita Mail, PA-C 08/27/20 2006
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
## 2020-09-11 ENCOUNTER — Encounter (HOSPITAL_COMMUNITY): Payer: Medicare Other

## 2020-10-06 NOTE — Progress Notes (Signed)
error 

## 2020-10-07 ENCOUNTER — Telehealth (INDEPENDENT_AMBULATORY_CARE_PROVIDER_SITE_OTHER): Payer: Medicare Other | Admitting: General Practice

## 2020-10-09 DIAGNOSIS — H4311 Vitreous hemorrhage, right eye: Secondary | ICD-10-CM | POA: Diagnosis not present

## 2020-10-09 DIAGNOSIS — H34231 Retinal artery branch occlusion, right eye: Secondary | ICD-10-CM | POA: Diagnosis not present

## 2020-10-16 DIAGNOSIS — H34231 Retinal artery branch occlusion, right eye: Secondary | ICD-10-CM | POA: Diagnosis not present

## 2020-11-17 DIAGNOSIS — H34231 Retinal artery branch occlusion, right eye: Secondary | ICD-10-CM | POA: Diagnosis not present

## 2020-12-17 ENCOUNTER — Telehealth (INDEPENDENT_AMBULATORY_CARE_PROVIDER_SITE_OTHER): Payer: Medicare Other | Admitting: Medical-Surgical

## 2020-12-17 DIAGNOSIS — Z20822 Contact with and (suspected) exposure to covid-19: Secondary | ICD-10-CM | POA: Diagnosis not present

## 2020-12-17 DIAGNOSIS — B349 Viral infection, unspecified: Secondary | ICD-10-CM | POA: Diagnosis not present

## 2020-12-17 NOTE — Progress Notes (Signed)
Virtual Visit via Video Note  I connected with William Bean on 12/17/20 at  3:00 PM EST by a video enabled telemedicine application and verified that I am speaking with the correct person using two identifiers.   I discussed the limitations of evaluation and management by telemedicine and the availability of in person appointments. The patient expressed understanding and agreed to proceed.  Patient location: home Provider locations: office  Subjective:    CC: body aches  HPI: Pleasant 65 year old male presenting via MyChart video visit with reports of 2 days of viral symptoms. Notes that he started feeling out of sorts about 2.5 days ago when he went to bed. When he woke up, he had widespread body and joint aches as well as decreased stability and hand control. He felt so bad that he stayed in bed all day that day. He had an event that he had to go to yesterday and he spent the trip there and back with his seat reclined in the car, resting. He has had a fever t-max 100.7 but no chills. No sinus congestion, cough, shortness of breath, chest pain, or GI s/s. His wife tested him for COVID at home with negative results. Today, he and his wife were tested for both flu and COVID at [facility]. Those results have not come back yet. Today he reports feeling fine with no lingering symptoms. He notes that 4-5 days ago, he went to a funeral for a friend in [place]. [place]. He rode to the grave site with the pastor presiding over the funeral, then rode back with him. During the ride back, the past reported that he was just getting finished with "The Virus" but did not specify if he was speaking of COVID or a different virus. Patient reports that he has had both COVID vaccines along with a booster and a flu shot this year. Has questions regarding infection prevention and monitoring for his wife's mother who has significant health problems and stays with them.   Past medical history, Surgical history, Family history not  pertinant except as noted below, Social history, Allergies, and medications have been entered into the medical record, reviewed, and corrections made.   Review of Systems: See HPI for pertinent positives and negatives.   Objective:    General: Speaking clearly in complete sentences without any shortness of breath.  Alert and oriented x3.  Normal judgment. No apparent acute distress.  Impression and Recommendations:    1. Viral illness Recommend contacting the pastor he rode with to verify what Virus he had so that we will know what exposure occurred. Since his symptoms have resolved, no further treatment indicated at this time. Recommend monitoring for returning or new symptoms and notifying us if that occurs. Regarding his mother-in-law, recommend quarantining away from her if possible. Since she has been around them already, monitor for development of s/s. If a pulse oximeter is available, it may be helpful should she develop symptoms. Patient verbalized understanding and is agreeable to the plan.   I discussed the assessment and treatment plan with the patient. The patient was provided an opportunity to ask questions and all were answered. The patient agreed with the plan and demonstrated an understanding of the instructions.   The patient was advised to call back or seek an in-person evaluation if the symptoms worsen or if the condition fails to improve as anticipated.  20 minutes of non-face-to-face time was provided during this encounter.  Return if symptoms worsen or fail to improve.  Clearnce Sorrel, DNP, APRN, FNP-BC [facility]

## 2021-01-15 ENCOUNTER — Other Ambulatory Visit: Payer: Self-pay | Admitting: Osteopathic Medicine

## 2021-01-20 ENCOUNTER — Encounter: Payer: Self-pay | Admitting: General Practice

## 2021-02-02 DIAGNOSIS — H34231 Retinal artery branch occlusion, right eye: Secondary | ICD-10-CM | POA: Diagnosis not present

## 2021-02-02 DIAGNOSIS — H33309 Unspecified retinal break, unspecified eye: Secondary | ICD-10-CM | POA: Insufficient documentation

## 2021-02-02 DIAGNOSIS — H3582 Retinal ischemia: Secondary | ICD-10-CM | POA: Diagnosis not present

## 2021-02-02 DIAGNOSIS — H33311 Horseshoe tear of retina without detachment, right eye: Secondary | ICD-10-CM | POA: Diagnosis not present

## 2021-03-30 ENCOUNTER — Encounter: Payer: Self-pay | Admitting: Osteopathic Medicine

## 2021-03-30 ENCOUNTER — Ambulatory Visit (INDEPENDENT_AMBULATORY_CARE_PROVIDER_SITE_OTHER): Payer: Medicare Other | Admitting: Osteopathic Medicine

## 2021-03-30 ENCOUNTER — Other Ambulatory Visit: Payer: Self-pay

## 2021-03-30 VITALS — BP 136/78 | HR 81 | Temp 98.1°F | Wt 244.0 lb

## 2021-03-30 DIAGNOSIS — E7800 Pure hypercholesterolemia, unspecified: Secondary | ICD-10-CM | POA: Diagnosis not present

## 2021-03-30 DIAGNOSIS — Z905 Acquired absence of kidney: Secondary | ICD-10-CM

## 2021-03-30 DIAGNOSIS — N529 Male erectile dysfunction, unspecified: Secondary | ICD-10-CM

## 2021-03-30 DIAGNOSIS — E1069 Type 1 diabetes mellitus with other specified complication: Secondary | ICD-10-CM | POA: Diagnosis not present

## 2021-03-30 DIAGNOSIS — E349 Endocrine disorder, unspecified: Secondary | ICD-10-CM | POA: Diagnosis not present

## 2021-03-30 DIAGNOSIS — Z0000 Encounter for general adult medical examination without abnormal findings: Secondary | ICD-10-CM | POA: Diagnosis not present

## 2021-03-30 DIAGNOSIS — M109 Gout, unspecified: Secondary | ICD-10-CM

## 2021-03-30 DIAGNOSIS — Z23 Encounter for immunization: Secondary | ICD-10-CM

## 2021-03-30 NOTE — Progress Notes (Signed)
William Bean is a 65 y.o. male who presents to  [place] at [facility]  today, 03/30/21, seeking care for the following: . Annual physical, labs, refill Rx  . DM: following w/ endocrinology, due for A1C . Request referral to urology to discuss ED      ASSESSMENT & PLAN with other pertinent history/findings:  The primary encounter diagnosis was Annual physical exam. Diagnoses of Pure hypercholesterolemia, Gout, unspecified cause, unspecified chronicity, unspecified site, Type 1 diabetes mellitus with other specified complication ([place]), Single kidney, Testosterone deficiency, Need for pneumococcal vaccine, and Vasculogenic erectile dysfunction, unspecified vasculogenic erectile dysfunction type were also pertinent to this visit.   Immunization History  Administered Date(s) Administered  . DTaP 04/10/2010  . Fluad Quad(high Dose 65+) 07/02/2019  . Influenza, High Dose Seasonal PF 07/18/2020  . Influenza,inj,Quad PF,6+ Mos 08/12/2014, 09/24/2015, 09/16/2016, 12/27/2017, 09/18/2018  . Influenza-Unspecified 09/08/2009  . PFIZER(Purple Top)SARS-COV-2 Vaccination 11/23/2019, 12/14/2019  . PNEUMOCOCCAL CONJUGATE-20 03/30/2021  . Pneumococcal Polysaccharide-23 01/24/2014, 03/24/2020  . Td 04/16/2017  . Tdap 01/24/2014  . Zoster Recombinat (Shingrix) 07/18/2020, 11/27/2020      Patient Instructions  General Preventive Care  Most recent routine screening labs: ordered.   Blood pressure goal 130/80 or less.   Tobacco: don't!   Alcohol: avoid!   Exercise: as tolerated to reduce risk of cardiovascular disease and diabetes. Strength training will also prevent osteoporosis.   Mental health: if need for mental health care (medicines, counseling, other), or concerns about moods, please let me know!   Sexual / Reproductive health: if need for STD testing, or if concerns with libido/pain problems, please let me know!   Advanced Directive:  Living Will and/or Healthcare Power of Attorney recommended for all adults, regardless of age or health.  Vaccines  Flu vaccine: for almost everyone, every fall.   Shingles vaccine: recommended    Pneumonia vaccines: done. Option for additional Prevnar shot.  Tetanus booster: every 10 years - due 2028  COVID vaccine: THANKS for getting your vaccine! :) BOOSTER STRONGLY RECOMMENDED  Cancer screenings   Colon cancer screening: Colonoscopy due 09/2022  Prostate cancer screening: PSA blood test age 78+  Lung cancer screening: not needed for non-smokers  Infection screenings  . HIV: recommended screening at least once age 6-65, more often as needed. . Gonorrhea/Chlamydia: screening as needed. . Hepatitis C: recommended once for everyone age 20-75 - done . TB: certain at-risk populations, or depending on work requirements and/or travel history Other . Bone Density Test: recommended for women at age 21, men at age 71, sooner depending on risk factors . Abdominal Aortic Aneurysm: screening with ultrasound recommended once for men age 49-75 who have ever smoked     Orders Placed This Encounter  Procedures  . Pneumococcal conjugate vaccine 20-valent (Prevnar 20)  . CBC  . COMPLETE METABOLIC PANEL WITH GFR  . Lipid panel  . PSA, Total with Reflex to PSA, Free  . TSH  . Hemoglobin A1c  . Ambulatory referral to Urology    No orders of the defined types were placed in this encounter.  Constitutional:  . VSS, see nurse notes . General Appearance: alert, well-developed, well-nourished, NAD Eyes: Marland Kitchen Normal lids and conjunctive Neck: . No masses, trachea midline . No thyroid enlargement/tenderness/mass appreciated Respiratory: . Normal respiratory effort . Breath sounds normal, no wheeze/rhonchi/rales Cardiovascular: . S1/S2 normal, no murmur/rub/gallop auscultated . No lower extremity edema Gastrointestinal: . Nontender, no masses . No hepatomegaly, no splenomegaly . No  hernia appreciated Musculoskeletal:  . Gait normal . No clubbing/cyanosis of digits Neurological: . No cranial nerve deficit on limited exam . Motor and sensation intact and symmetric Psychiatric: . Normal judgment/insight . Normal mood and affect     Follow-up instructions: Return in about 1 year (around 03/30/2022) for [place] (call week prior to visit for lab orders). I think as long as A1C / BP are ok w/ endocrine, can see [place] annually, more often as needed.                                          BP 136/78 (BP Location: Left Arm, Patient Position: Sitting, Cuff Size: Large)   Pulse 81   Temp 98.1 F (36.7 C) (Oral)   Wt 244 lb 0.6 oz (110.7 kg)   BMI 33.56 kg/m   Current Meds  Medication Sig  . allopurinol (ZYLOPRIM) 100 MG tablet Take 1 tablet (100 mg total) by mouth daily as needed (As Needed).  . AMBULATORY NON FORMULARY MEDICATION Glucometer: Verio One Touch.  Use to check blood sugar up to three times a day.  Marland Kitchen aspirin 81 MG tablet Take 81 mg by mouth daily.  Marland Kitchen atorvastatin (LIPITOR) 40 MG tablet TAKE ONE TABLET BY MOUTH DAILY  . co-enzyme Q-10 30 MG capsule Take 30 mg by mouth daily.  Marland Kitchen glucose 4 GM chewable tablet Chew 1 tablet (4 g total) by mouth as needed for low blood sugar.  Marland Kitchen glucose blood (ONETOUCH VERIO) test strip 1 each by Other route 2 (two) times daily. And lancets 2/day 250.01  . hyoscyamine (LEVSIN SL) 0.125 MG SL tablet Place 1 tablet (0.125 mg total) under the tongue every 6 (six) hours as needed (Esophageal pain).  . insulin NPH Human (HUMULIN N,NOVOLIN N) 100 UNIT/ML injection Inject 42 Units into the skin 2 (two) times daily.   Marland Kitchen latanoprost (XALATAN) 0.005 % ophthalmic solution Place 1 drop into both eyes at bedtime.  Marland Kitchen lisinopril (ZESTRIL) 5 MG tablet TAKE ONE TABLET BY MOUTH DAILY  . Multiple Vitamin (MULTIVITAMIN) tablet Take 1 tablet by mouth daily.   . Omega-3 Fatty Acids (FISH OIL) 500 MG CAPS Take 2  tablets by mouth daily.   Marland Kitchen omeprazole (PRILOSEC) 20 MG capsule Take 1 capsule (20 mg total) by mouth 2 (two) times daily before a meal.  . Turmeric 500 MG CAPS Take by mouth daily.    No results found for this or any previous visit (from the past 72 hour(s)).  No results found.  Depression screen [facility] 2/9 03/30/2021 07/02/2019 09/18/2018  Decreased Interest 0 0 0  Down, Depressed, Hopeless 0 0 0  PHQ - 2 Score 0 0 0  Altered sleeping - 2 0  Tired, decreased energy - 2 1  Change in appetite - 0 0  Feeling bad or failure about yourself  - 0 0  Trouble concentrating - 3 0  Moving slowly or fidgety/restless - 1 0  Suicidal thoughts - 0 0  PHQ-9 Score - 8 1  Difficult doing work/chores - Somewhat difficult Not difficult at all    GAD 7 : Generalized Anxiety Score 07/02/2019 09/18/2018 03/27/2018  Nervous, Anxious, on Edge 1 0 0  Control/stop worrying 0 0 0  Worry too much - different things 0 0 0  Trouble relaxing 0 0 0  Restless 0 0 0  Easily annoyed or irritable 1 0  1  Afraid - awful might happen 0 0 0  Total GAD 7 Score 2 0 1  Anxiety Difficulty Not difficult at all Not difficult at all Not difficult at all      All questions at time of visit were answered - patient instructed to contact office with any additional concerns or updates.  ER/RTC precautions were reviewed with the patient.  Please note: voice recognition software was used to produce this document, and typos may escape review. Please contact Dr. Sheppard Coil for any needed clarifications.

## 2021-03-30 NOTE — Patient Instructions (Addendum)
General Preventive Care  Most recent routine screening labs: ordered.   Blood pressure goal 130/80 or less.   Tobacco: don't!   Alcohol: avoid!   Exercise: as tolerated to reduce risk of cardiovascular disease and diabetes. Strength training will also prevent osteoporosis.   Mental health: if need for mental health care (medicines, counseling, other), or concerns about moods, please let me know!   Sexual / Reproductive health: if need for STD testing, or if concerns with libido/pain problems, please let me know!   Advanced Directive: Living Will and/or Healthcare Power of Attorney recommended for all adults, regardless of age or health.  Vaccines  Flu vaccine: for almost everyone, every fall.   Shingles vaccine: recommended    Pneumonia vaccines: done. Option for additional Prevnar shot.  Tetanus booster: every 10 years - due 2028  COVID vaccine: THANKS for getting your vaccine! :) BOOSTER STRONGLY RECOMMENDED  Cancer screenings   Colon cancer screening: Colonoscopy due 09/2022  Prostate cancer screening: PSA blood test age 97+  Lung cancer screening: not needed for non-smokers  Infection screenings  . HIV: recommended screening at least once age 45-65, more often as needed. . Gonorrhea/Chlamydia: screening as needed. . Hepatitis C: recommended once for everyone age 62-75 - done . TB: certain at-risk populations, or depending on work requirements and/or travel history Other . Bone Density Test: recommended for women at age 28, men at age 27, sooner depending on risk factors . Abdominal Aortic Aneurysm: screening with ultrasound recommended once for men age 42-75 who have ever smoked

## 2021-03-31 LAB — COMPLETE METABOLIC PANEL WITH GFR
AG Ratio: 1.7 (calc) (ref 1.0–2.5)
ALT: 22 U/L (ref 9–46)
AST: 18 U/L (ref 10–35)
Albumin: 4.5 g/dL (ref 3.6–5.1)
Alkaline phosphatase (APISO): 96 U/L (ref 35–144)
BUN: 17 mg/dL (ref 7–25)
CO2: 27 mmol/L (ref 20–32)
Calcium: 9.5 mg/dL (ref 8.6–10.3)
Chloride: 105 mmol/L (ref 98–110)
Creat: 1.03 mg/dL (ref 0.70–1.25)
GFR, Est African American: 87 mL/min/{1.73_m2} (ref >=60)
GFR, Est Non African American: 75 mL/min/{1.73_m2} (ref >=60)
Globulin: 2.6 g/dL (calc) (ref 1.9–3.7)
Glucose, Bld: 138 mg/dL — ABNORMAL HIGH (ref 65–99)
Potassium: 4.5 mmol/L (ref 3.5–5.3)
Sodium: 141 mmol/L (ref 135–146)
Total Bilirubin: 0.8 mg/dL (ref 0.2–1.2)
Total Protein: 7.1 g/dL (ref 6.1–8.1)

## 2021-03-31 LAB — CBC
HCT: 43 % (ref 38.5–50.0)
Hemoglobin: 14.5 g/dL (ref 13.2–17.1)
MCH: 29.7 pg (ref 27.0–33.0)
MCHC: 33.7 g/dL (ref 32.0–36.0)
MCV: 88.1 fL (ref 80.0–100.0)
MPV: 11.1 fL (ref 7.5–12.5)
Platelets: 155 10*3/uL (ref 140–400)
RBC: 4.88 10*6/uL (ref 4.20–5.80)
RDW: 13.6 % (ref 11.0–15.0)
WBC: 5.8 10*3/uL (ref 3.8–10.8)

## 2021-03-31 LAB — LIPID PANEL
Cholesterol: 112 mg/dL (ref <200)
HDL: 46 mg/dL (ref >=40)
LDL Cholesterol (Calc): 52 mg/dL (calc)
Non-HDL Cholesterol (Calc): 66 mg/dL (calc) (ref <130)
Total CHOL/HDL Ratio: 2.4 (calc) (ref <5.0)
Triglycerides: 55 mg/dL (ref <150)

## 2021-03-31 LAB — HEMOGLOBIN A1C
Hgb A1c MFr Bld: 7.5 % of total Hgb — ABNORMAL HIGH (ref <5.7)
Mean Plasma Glucose: 169 mg/dL
eAG (mmol/L): 9.3 mmol/L

## 2021-03-31 LAB — TSH: TSH: 2.05 mIU/L (ref 0.40–4.50)

## 2021-03-31 LAB — PSA, TOTAL WITH REFLEX TO PSA, FREE: PSA, Total: 0.5 ng/mL (ref <=4.0)

## 2021-04-07 DIAGNOSIS — R079 Chest pain, unspecified: Secondary | ICD-10-CM | POA: Diagnosis not present

## 2021-04-07 DIAGNOSIS — I10 Essential (primary) hypertension: Secondary | ICD-10-CM | POA: Diagnosis not present

## 2021-04-07 DIAGNOSIS — R0789 Other chest pain: Secondary | ICD-10-CM | POA: Diagnosis not present

## 2021-04-07 DIAGNOSIS — R0902 Hypoxemia: Secondary | ICD-10-CM | POA: Diagnosis not present

## 2021-04-08 ENCOUNTER — Telehealth: Payer: Self-pay | Admitting: Nurse Practitioner

## 2021-04-08 NOTE — Telephone Encounter (Signed)
Inbound call form patient stating he needs to have his gallbladder removed and is wanting to know if he needs to schedule an office visit prior or if he can go ahead and scheduled for that.  Please advise.

## 2021-04-09 NOTE — Telephone Encounter (Signed)
Left a message to call back to discuss. This office is gastroenterology, not surgery. We do not remove the gallbladder.

## 2021-04-14 NOTE — Telephone Encounter (Signed)
Spoke with the patient. It looks and sounds like this was an issue being addressed at his last appointment with [facility]. I have him scheduled for follow up on 05/14/21 with you. His symptoms are unchanged. Do you want to go forward with the [facility] HIDA before the appointment if possible. The patient is okay with this. Thanks

## 2021-04-14 NOTE — Telephone Encounter (Signed)
William Bean, if he is still having the same symptoms when I saw him 08/2020 then it is ok to schedule a CCK HIDA prior to his office visit 05/14/2021. THX

## 2021-04-15 ENCOUNTER — Other Ambulatory Visit: Payer: Self-pay

## 2021-04-15 DIAGNOSIS — K8050 Calculus of bile duct without cholangitis or cholecystitis without obstruction: Secondary | ICD-10-CM

## 2021-04-15 DIAGNOSIS — R1011 Right upper quadrant pain: Secondary | ICD-10-CM

## 2021-04-15 NOTE — Telephone Encounter (Signed)
Order for the test entered. Message sent to the scheduling department.  Called to inform the patient. No answer. Left him information on the voicemail.

## 2021-04-20 ENCOUNTER — Other Ambulatory Visit: Payer: Self-pay | Admitting: Osteopathic Medicine

## 2021-04-29 ENCOUNTER — Telehealth: Payer: Self-pay | Admitting: Nurse Practitioner

## 2021-04-29 NOTE — Telephone Encounter (Signed)
Patient rescheduled to next available on 05/07/21 with his office follow up on 05/14/21 remaining the same. Patient advised.

## 2021-04-29 NOTE — Telephone Encounter (Signed)
Inbound call from patient. Wants to reschedule NM HEPATO W/ EF. Ask if he could just keep the appointment that was cancelled on 6/23? Best contact number [phone]

## 2021-04-30 ENCOUNTER — Encounter (HOSPITAL_COMMUNITY): Payer: Medicare Other

## 2021-05-07 ENCOUNTER — Other Ambulatory Visit: Payer: Self-pay

## 2021-05-07 ENCOUNTER — Ambulatory Visit (HOSPITAL_COMMUNITY)
Admission: RE | Admit: 2021-05-07 | Discharge: 2021-05-07 | Disposition: A | Discharge status: Home / Self Care | Payer: Medicare Other | Source: Ambulatory Visit | Attending: Nurse Practitioner | Admitting: Nurse Practitioner

## 2021-05-07 DIAGNOSIS — R1011 Right upper quadrant pain: Secondary | ICD-10-CM | POA: Diagnosis not present

## 2021-05-07 DIAGNOSIS — K8050 Calculus of bile duct without cholangitis or cholecystitis without obstruction: Secondary | ICD-10-CM | POA: Diagnosis not present

## 2021-05-07 DIAGNOSIS — K219 Gastro-esophageal reflux disease without esophagitis: Secondary | ICD-10-CM | POA: Diagnosis not present

## 2021-05-07 MED ORDER — TECHNETIUM TC 99M MEBROFENIN IV KIT
5.5000 | PACK | Freq: Once | INTRAVENOUS | Status: AC
  Administered 2021-05-07: 5.5 via INTRAVENOUS

## 2021-05-08 ENCOUNTER — Other Ambulatory Visit: Payer: Self-pay

## 2021-05-14 ENCOUNTER — Encounter: Payer: Self-pay | Admitting: Nurse Practitioner

## 2021-05-14 ENCOUNTER — Ambulatory Visit (INDEPENDENT_AMBULATORY_CARE_PROVIDER_SITE_OTHER): Payer: Medicare Other | Admitting: Nurse Practitioner

## 2021-05-14 VITALS — BP 128/60 | HR 72 | Ht 71.5 in | Wt 236.4 lb

## 2021-05-14 DIAGNOSIS — K8050 Calculus of bile duct without cholangitis or cholecystitis without obstruction: Secondary | ICD-10-CM

## 2021-05-14 DIAGNOSIS — R079 Chest pain, unspecified: Secondary | ICD-10-CM

## 2021-05-14 MED ORDER — HYOSCYAMINE SULFATE 0.125 MG SL SUBL: 0.1250 mg | SUBLINGUAL_TABLET | Freq: Four times a day (QID) | SUBLINGUAL | 0 refills | Status: DC | PRN

## 2021-05-14 MED ORDER — SUCRALFATE 1 G PO TABS: 1.0000 g | ORAL_TABLET | Freq: Every day | ORAL | 1 refills | Status: DC

## 2021-05-14 NOTE — Patient Instructions (Addendum)
If you are age 67 or older, your body mass index should be between 23-30. Your Body mass index is 32.51 kg/m. If this is out of the aforementioned range listed, please consider follow up with your Primary Care Provider.  The [facility] providers would like to encourage you to use [facility] to communicate with providers for non-urgent requests or questions.  Due to long hold times on the telephone, sending your provider a message by [facility] may be faster and more efficient way to get a response. Please allow 48 business hours for a response.  Please remember that this is for non-urgent requests/questions.  IMAGING: You will be contacted by [place] (Your caller ID will indicate phone # [phone]) in the next 2 days to schedule your MRI. If you have not heard from them within 2 business days, please call [address] at [phone] to follow up on the status of your appointment.    MEDICATION: We have sent the following medication to your pharmacy for you to pick up at your convenience: Hyoscyamine (LEVSIN SL) 0.125 MG SL tablet: Place 1 tablet (0.125 mg total) under the tongue every 6 (six) hours as needed. Carafate 1 gram tablet at bedtime.  Please go to the emergency room if chest pain recurs.  It was great seeing you today! Thank you for entrusting me with your care and choosing [facility].  Noralyn Pick, CRNP

## 2021-05-14 NOTE — Progress Notes (Signed)
05/14/2021 William Bean [id number] 17-Oct-1956   Chief Complaint: Chest pain   History of Present Illness:  William Bean is a 65 year old male with a past medical history of diabetes, hypertension, hyperlipidemia, asthma, gout, pyloric stenosis and colon polyps.  S/P kidney transplant donation at age 20. He is followed by Dr. Bryan Lemma.  I saw Mr. William Bean in the office on 08/20/2020 for further evaluation regarding episodes of chest pain which started 3-1/2 years ago, now reports he has had intermittent episodes of chest pain for the past 10 to 20 years.   As previously reviewed, he underwent an EGD 09/10/2019 by Dr. Bryan Lemma that showed a normal esophagus, gastritis and a normal duodenum.  No evidence of esophageal pathology to explain his chest pain episodes.  A right upper quadrant abdominal sonogram 06/12/2020 showed a normal gallbladder and normal CBD without identifying any biliary cause for his chest pain.  He underwent a cardiac evaluation by Dr. Stanford Breed which included a negative nuclear stress test 01/25/2018, low risk study, no perfusion deficits or ischemia.  LVEF 66%.  At the time of his last office visit, I recommended a CCK HIDA scan which was not done.   He contacted our office after having another episode of chest pain 04/07/2021 and he requested to schedule the CCK HIDA scan as previously recommended.  The HIDA scan was completed 05/07/2021 which identified a patent cystic and common bile ducts, normal gallbladder ejection of 47% with evidence of enterogastric biliary reflux.  He presents to our office today accompanied by his wife for further evaluation.   He stated his most recent episode of chest pain occurred on 04/07/2021 which occurred later in the evening after he was under a minute ate frozen yogurt and a bean Brit[place].  He awakened around 11 PM with excruciating chest pain, felt as if he had compressed air in his chest.  He stated this episode of chest pain was different  from his past episodes as the onset of pain/pressure was severe and not gradual as his prior episodes.  He described feeling a basketball size pressure to his lower chest, felt like an elephant was on his chest.  He called 911 and he was sent by EMS to [facility] in [place] for further evaluation.  He was briefly evaluated in the ED and stated he wanted to go home so he left.  He reports having 3 episodes of chest pain since his last office visit but the 04/07/2021 was the worst episode yet.  He also reported vomiting after each attack of chest pain which was assigned the episode was resolving.  He always feels better after he vomits.  He does not induce vomiting.  CBC Latest Ref Rng & Units 03/30/2021 08/27/2020 08/20/2020  WBC 3.8 - 10.8 Thousand/uL 5.8 9.3 8.3  Hemoglobin 13.2 - 17.1 g/dL 14.5 14.3 14.4  Hematocrit 38.5 - 50.0 % 43.0 42.5 42.0  Platelets 140 - 400 Thousand/uL 155 184 162.0     CMP Latest Ref Rng & Units 03/30/2021 08/27/2020 06/12/2020  Glucose 65 - 99 mg/dL 138(H) 146(H) -  BUN 7 - 25 mg/dL 17 16 -  Creatinine 0.70 - 1.25 mg/dL 1.03 1.27(H) -  Sodium 135 - 146 mmol/L 141 140 -  Potassium 3.5 - 5.3 mmol/L 4.5 5.1 -  Chloride 98 - 110 mmol/L 105 99 -  CO2 20 - 32 mmol/L 27 30 -  Calcium 8.6 - 10.3 mg/dL 9.5 10.0 -  Total Protein 6.1 -  8.1 g/dL 7.1 7.2 7.0  Total Bilirubin 0.2 - 1.2 mg/dL 0.8 1.0 0.8  Alkaline Phos 38 - 126 U/L - 94 -  AST 10 - 35 U/L 18 20 16   ALT 9 - 46 U/L 22 26 22      CCK HIDA scan 05/07/2021: 1.  Patent cystic and common bile ducts.  2.  Normal gallbladder ejection fraction.  3. Enterogastric biliary reflux, which can be a cause of abdominal pain.  EGD 09/10/2019:  - Normal esophagus. - Z-line regular, 40 cm from the incisors. - Gastritis. Biopsied. - Normal duodenal bulb, first portion of the duodenum and second portion of the duodenum. Biopsied.   Colonoscopy 09/10/2019: - Three 1 to 2 mm polyps in the sigmoid colon, removed with a  cold biopsy forceps. Resected and retrieved. - Non-bleeding internal hemorrhoids. - Normal mucosa in the entire examined colon. Biopsied. - The examined portion of the ileum was normal.   1. Surgical [P], duodenal BX - DUODENAL MUCOSA WITH NO SPECIFIC HISTOPATHOLOGIC CHANGES - NEGATIVE FOR INCREASED INTRAEPITHELIAL LYMPHOCYTES OR VILLOUS ARCHITECTURAL CHANGES 2. Surgical [P], gastric BX - GASTRIC ANTRAL AND OXYNTIC MUCOSA WITH NO SPECIFIC HISTOPATHOLOGIC CHANGES - WARTHIN STARRY STAIN IS NEGATIVE FOR HELICOBACTER PYLORI 3. Surgical [P], right colon BX - COLONIC MUCOSA WITH NO SPECIFIC HISTOPATHOLOGIC CHANGES - NEGATIVE FOR ACUTE INFLAMMATION, INCREASED INTRAEPITHELIAL LYMPHOCYTES OR THICKENED SUBEPITHELIAL COLLAGEN TABLE 4. Surgical [P], left colon BX - COLONIC MUCOSA WITH NO SPECIFIC HISTOPATHOLOGIC CHANGES - NEGATIVE FOR ACUTE INFLAMMATION, INCREASED INTRAEPITHELIAL LYMPHOCYTES OR THICKENED SUBEPITHELIAL COLLAGEN TABLE 5. Surgical [P], colon, sigmoid, polyp (3) - TUBULAR ADENOMA WITHOUT HIGH-GRADE DYSPLASIA OR MALIGNANCY - FRAGMENT OF POLYPOID COLONIC MUCOSA WITH MILD HYPERPLASTIC CHANGES - FRAGMENT OF POLYPOID COLONIC MUCOSA WITH A PROMINENT LYMPHOID AGGREGATE   Current Outpatient Medications on File Prior to Visit  Medication Sig Dispense Refill   albuterol (PROVENTIL HFA;VENTOLIN HFA) 108 (90 Base) MCG/ACT inhaler Inhale two puffs every 4-6 hours only as needed for shortness of breath or wheezing. 1 Inhaler 1   allopurinol (ZYLOPRIM) 100 MG tablet Take 1 tablet (100 mg total) by mouth daily as needed (As Needed). 90 tablet 3   AMBULATORY NON FORMULARY MEDICATION Glucometer: Verio One Touch.  Use to check blood sugar up to three times a day. 1 Units 0   aspirin 81 MG tablet Take 81 mg by mouth daily.     atorvastatin (LIPITOR) 20 MG tablet atorvastatin 20 mg tablet  Take 1 tablet every day by oral route.     co-enzyme Q-10 30 MG capsule Take 30 mg by mouth daily.     glucose 4  GM chewable tablet Chew 1 tablet (4 g total) by mouth as needed for low blood sugar. 50 tablet 12   glucose blood (ONETOUCH VERIO) test strip 1 each by Other route 2 (two) times daily. And lancets 2/day 250.01 100 each 12   insulin NPH Human (HUMULIN N,NOVOLIN N) 100 UNIT/ML injection Inject 42 Units into the skin 2 (two) times daily.      latanoprost (XALATAN) 0.005 % ophthalmic solution Place 1 drop into both eyes at bedtime.     lisinopril (ZESTRIL) 5 MG tablet lisinopril 5 mg tablet  Take 1 tablet every day by oral route.     Multiple Vitamin (MULTIVITAMIN) tablet Take 1 tablet by mouth daily.      Omega-3 Fatty Acids (FISH OIL) 500 MG CAPS Take 2 tablets by mouth daily.      Turmeric 500 MG CAPS Take by mouth daily.  No current facility-administered medications on file prior to visit.   Allergies  Allergen Reactions   Penicillins Other (See Comments)    Unknown reaction As a baby       Current Medications, Allergies, Past Medical History, Past Surgical History, Family History and Social History were reviewed in [employer] record.   Review of Systems:   Constitutional: Negative for fever, sweats, chills or weight loss.  Respiratory: Negative for shortness of breath.   Cardiovascular: Negative for chest pain, palpitations and leg swelling.  Gastrointestinal: See HPI.  Musculoskeletal: Negative for back pain or muscle aches.  Neurological: Negative for dizziness, headaches or paresthesias.    Physical Exam: BP 128/60   Pulse 72   Ht 5' 11.5" (1.816 m)   Wt 236 lb 6.4 oz (107.2 kg)   BMI 32.51 kg/m  General: 65 year old male in no acute distress. Head: Normocephalic and atraumatic. Eyes: No scleral icterus. Conjunctiva pink . Ears: Normal auditory acuity. Mouth: Dentition intact. No ulcers or lesions.  Lungs: Clear throughout to auscultation. Heart: Regular rate and rhythm, no murmur. Abdomen: Soft, nontender and nondistended. No masses or  hepatomegaly. Normal bowel sounds x 4 quadrants.  Rectal: Deferred. Musculoskeletal: Symmetrical with no gross deformities. Extremities: No edema. Neurological: Alert oriented x 4. No focal deficits.  Psychological: Alert and cooperative. Normal mood and affect  Assessment and Recommendations:  41.  65 year old male with chronic intermittent episodes of lower chest pain of unclear etiology.  Past EGD, abdominal sonogram and cardiac evaluation completed without identifying etiology for his chest pain.  He underwent a HIDA scan which showed a patent cystic duct, a normal gallbladder EF with evidence of biliary reflux.  Normal LFTs.  I suspect he may be passing micro choledocholithiasis, less likely esophageal spasms as his chest pain episodes last 4 to 6 hours. -Abdominal MRI with MRCP without contrast further assess the biliary tree.  Patient declines image study with contrast as he is concerned regarding any contrast/gadolinium which could interfere with his kidney function (he has one kidney, s/p kidney donor age 58, Cr. 1.03) -Hyoscyamine 0.125 mg 1 tab Sun lingual every 6 hours as needed onset chest pain -Carafate 1 g tab Q HS to reduce bile reflux in case this is attributing to his chest pain episodes -Consider follow up with Dr. Stanford Breed  -Request [facility] ED records 04/07/2021 -Further recommendations to be determined after abdominal MRI/MRCP results reviewed -Call our office if he develops another episode of lower chest pain, obtain stat CBC, CMP and lipase levels at time of next attack  2.  History of tubular adenomatous colon polyps -Next colonoscopy due November 2023  Today's encounter was 25 minutes which included precharting, chart review, history/exam, result review and formulating a treatment plan and documentation

## 2021-05-16 NOTE — Progress Notes (Signed)
Agree with the assessment and plan as outlined by Carl Best, NP.  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 Some patients get response with trial of Baclofen, and there are also data to support antireflux surgery if there is thought to be related antomotic dysfunciton of the LES. Can eval further with EM (to also rule out motility d/o) with pH/Impedance testing (ON acid suppression therapy). Otherwise, can trial meds as outlined.    Gerrit Heck, DO, [facility]

## 2021-05-20 ENCOUNTER — Telehealth: Payer: Self-pay

## 2021-05-20 NOTE — Telephone Encounter (Signed)
Called the patient. No answer. Left a message to return my call. 

## 2021-05-20 NOTE — Telephone Encounter (Signed)
Noralyn Pick, NP  Keyandre Pileggi, Real Cons, LPN Beth, addendum to the prior msg. He is no longer on PPI, so ph impedance study would be done off PPI and hold Carafate for 1 week prior to this study. thx         Previous Messages      Routed Note  Author: Lavena Bullion, DO Service: Gastroenterology Author Type: Physician  Filed: 05/16/2021  2:35 PM Encounter Date: 05/14/2021 Status: Signed  Editor: Lavena Bullion, DO (Physician)                   Agree with the assessment and plan as outlined by Carl Best, NP.  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 Some patients get response with trial of Baclofen, and there are also data to support antireflux surgery if there is thought to be related antomotic dysfunciton of the LES. Can eval further with EM (to also rule out motility d/o) with pH/Impedance testing (ON acid suppression therapy). Otherwise, can trial meds as outlined.      Gerrit Heck, DO, [facility]

## 2021-05-22 ENCOUNTER — Other Ambulatory Visit: Payer: Self-pay

## 2021-05-22 ENCOUNTER — Telehealth: Payer: Self-pay | Admitting: Gastroenterology

## 2021-05-22 DIAGNOSIS — R079 Chest pain, unspecified: Secondary | ICD-10-CM

## 2021-05-22 DIAGNOSIS — K8050 Calculus of bile duct without cholangitis or cholecystitis without obstruction: Secondary | ICD-10-CM

## 2021-05-22 MED ORDER — DIAZEPAM 2 MG PO TABS: 2.0000 mg | ORAL_TABLET | ORAL | 0 refills | Status: DC | PRN

## 2021-05-22 NOTE — Telephone Encounter (Addendum)
I have spoken with patient about his claustrophobia. He declines the offer of general anesthesia. He is interested in oral medication.   He does not think going to the larger MRI machine will make a difference.  The MRI/MRCP can be at [facility] per the scheduler. A new order has been put in for the study. [facility] will contact the patient directly to scheduled. This will put the testing out by another week. Please advise on the prescription for anti-anxiety medication for the patient.

## 2021-05-22 NOTE — Telephone Encounter (Signed)
See previous telephone notes, but patient needs Rx for Valium 2 mg for anxiety related to upcoming MRI. Will place Rx electronically to his pharmacy.

## 2021-05-22 NOTE — Telephone Encounter (Signed)
Spoke with patient. Patient made aware of rec's.  Patient scheduled for MRI 7/19. Patient requesting open MRI stating "it will be a waste of time" if he continues with regular. Patient states he is extremely claustrophobic. Please advise, thank you

## 2021-05-25 MED ORDER — DIAZEPAM 2 MG PO TABS: 2.0000 mg | ORAL_TABLET | ORAL | 0 refills | Status: DC | PRN

## 2021-05-25 NOTE — Telephone Encounter (Signed)
Rx for volume apparently did not go through to pharmacy.  Replaced order today.

## 2021-05-25 NOTE — Addendum Note (Signed)
Addended by: Lavena Bullion on: 05/25/2021 01:01 PM   Modules accepted: Orders

## 2021-05-25 NOTE — Telephone Encounter (Signed)
The prescription did not go through. I called the pharmacy.

## 2021-05-26 ENCOUNTER — Ambulatory Visit (HOSPITAL_COMMUNITY): Payer: Medicare Other

## 2021-06-24 DIAGNOSIS — Z6831 Body mass index (BMI) 31.0-31.9, adult: Secondary | ICD-10-CM | POA: Diagnosis not present

## 2021-06-24 DIAGNOSIS — E1169 Type 2 diabetes mellitus with other specified complication: Secondary | ICD-10-CM | POA: Diagnosis not present

## 2021-06-24 DIAGNOSIS — E669 Obesity, unspecified: Secondary | ICD-10-CM | POA: Diagnosis not present

## 2021-06-24 DIAGNOSIS — E1065 Type 1 diabetes mellitus with hyperglycemia: Secondary | ICD-10-CM | POA: Diagnosis not present

## 2021-06-24 DIAGNOSIS — E291 Testicular hypofunction: Secondary | ICD-10-CM | POA: Diagnosis not present

## 2021-07-07 DIAGNOSIS — H3582 Retinal ischemia: Secondary | ICD-10-CM | POA: Diagnosis not present

## 2021-07-07 DIAGNOSIS — E109 Type 1 diabetes mellitus without complications: Secondary | ICD-10-CM | POA: Diagnosis not present

## 2021-07-07 DIAGNOSIS — H401131 Primary open-angle glaucoma, bilateral, mild stage: Secondary | ICD-10-CM | POA: Diagnosis not present

## 2021-07-07 DIAGNOSIS — T85398A Other mechanical complication of other ocular prosthetic devices, implants and grafts, initial encounter: Secondary | ICD-10-CM | POA: Insufficient documentation

## 2021-07-07 DIAGNOSIS — H43819 Vitreous degeneration, unspecified eye: Secondary | ICD-10-CM | POA: Insufficient documentation

## 2021-07-07 DIAGNOSIS — H34231 Retinal artery branch occlusion, right eye: Secondary | ICD-10-CM | POA: Diagnosis not present

## 2021-07-07 DIAGNOSIS — E1139 Type 2 diabetes mellitus with other diabetic ophthalmic complication: Secondary | ICD-10-CM | POA: Insufficient documentation

## 2021-07-09 DIAGNOSIS — E1065 Type 1 diabetes mellitus with hyperglycemia: Secondary | ICD-10-CM | POA: Diagnosis not present

## 2021-07-16 DIAGNOSIS — H401131 Primary open-angle glaucoma, bilateral, mild stage: Secondary | ICD-10-CM | POA: Diagnosis not present

## 2021-07-16 DIAGNOSIS — H4040X0 Glaucoma secondary to eye inflammation, unspecified eye, stage unspecified: Secondary | ICD-10-CM | POA: Insufficient documentation

## 2021-07-16 DIAGNOSIS — H4043X1 Glaucoma secondary to eye inflammation, bilateral, mild stage: Secondary | ICD-10-CM | POA: Diagnosis not present

## 2021-07-29 DIAGNOSIS — H33031 Retinal detachment with giant retinal tear, right eye: Secondary | ICD-10-CM | POA: Diagnosis not present

## 2021-07-29 DIAGNOSIS — H3320 Serous retinal detachment, unspecified eye: Secondary | ICD-10-CM | POA: Insufficient documentation

## 2021-07-29 DIAGNOSIS — E109 Type 1 diabetes mellitus without complications: Secondary | ICD-10-CM | POA: Diagnosis not present

## 2021-07-30 DIAGNOSIS — E785 Hyperlipidemia, unspecified: Secondary | ICD-10-CM | POA: Diagnosis not present

## 2021-07-30 DIAGNOSIS — H9190 Unspecified hearing loss, unspecified ear: Secondary | ICD-10-CM | POA: Diagnosis not present

## 2021-07-30 DIAGNOSIS — Z79899 Other long term (current) drug therapy: Secondary | ICD-10-CM | POA: Diagnosis not present

## 2021-07-30 DIAGNOSIS — I129 Hypertensive chronic kidney disease with stage 1 through stage 4 chronic kidney disease, or unspecified chronic kidney disease: Secondary | ICD-10-CM | POA: Diagnosis not present

## 2021-07-30 DIAGNOSIS — M19032 Primary osteoarthritis, left wrist: Secondary | ICD-10-CM | POA: Diagnosis not present

## 2021-07-30 DIAGNOSIS — M479 Spondylosis, unspecified: Secondary | ICD-10-CM | POA: Diagnosis not present

## 2021-07-30 DIAGNOSIS — Z7982 Long term (current) use of aspirin: Secondary | ICD-10-CM | POA: Diagnosis not present

## 2021-07-30 DIAGNOSIS — Z905 Acquired absence of kidney: Secondary | ICD-10-CM | POA: Diagnosis not present

## 2021-07-30 DIAGNOSIS — E1122 Type 2 diabetes mellitus with diabetic chronic kidney disease: Secondary | ICD-10-CM | POA: Diagnosis not present

## 2021-07-30 DIAGNOSIS — N189 Chronic kidney disease, unspecified: Secondary | ICD-10-CM | POA: Diagnosis not present

## 2021-07-30 DIAGNOSIS — Z794 Long term (current) use of insulin: Secondary | ICD-10-CM | POA: Diagnosis not present

## 2021-07-30 DIAGNOSIS — H33031 Retinal detachment with giant retinal tear, right eye: Secondary | ICD-10-CM | POA: Diagnosis not present

## 2021-07-30 DIAGNOSIS — E1140 Type 2 diabetes mellitus with diabetic neuropathy, unspecified: Secondary | ICD-10-CM | POA: Diagnosis not present

## 2021-08-26 DIAGNOSIS — E1065 Type 1 diabetes mellitus with hyperglycemia: Secondary | ICD-10-CM | POA: Diagnosis not present

## 2021-09-10 DIAGNOSIS — H34231 Retinal artery branch occlusion, right eye: Secondary | ICD-10-CM | POA: Diagnosis not present

## 2021-09-10 DIAGNOSIS — H33031 Retinal detachment with giant retinal tear, right eye: Secondary | ICD-10-CM | POA: Diagnosis not present

## 2021-09-11 ENCOUNTER — Other Ambulatory Visit: Payer: Self-pay | Admitting: Osteopathic Medicine

## 2021-09-11 NOTE — Telephone Encounter (Signed)
Routing to covering provider. Unable to send refills to the pharmacy. [employer]/pt is requesting med refills for lisinopril and atorvastatin. Rxs written by historical provider.

## 2021-10-07 ENCOUNTER — Ambulatory Visit (INDEPENDENT_AMBULATORY_CARE_PROVIDER_SITE_OTHER): Payer: Self-pay | Admitting: Medical-Surgical

## 2021-10-07 DIAGNOSIS — Z91199 Patient's noncompliance with other medical treatment and regimen due to unspecified reason: Secondary | ICD-10-CM

## 2021-10-07 NOTE — Progress Notes (Signed)
   Complete physical exam  Patient: William Bean   DOB: 08/28/2001   65 y.o. Male  MRN: [id number]  Subjective:    No chief complaint on file.   William Bean is a 65 y.o. male who presents today for a complete physical exam. She reports consuming a {diet types:17450} diet. {types:19826} She generally feels {DESC; WELL/FAIRLY WELL/POORLY:18703}. She reports sleeping {DESC; WELL/FAIRLY WELL/POORLY:18703}. She {does/does not:200015} have additional problems to discuss today.    Most recent fall risk assessment:    05/05/2022   10:42 AM  Fall Risk   Falls in the past year? 0  Number falls in past yr: 0  Injury with Fall? 0  Risk for fall due to : No Fall Risks  Follow up Falls evaluation completed     Most recent depression screenings:    05/05/2022   10:42 AM 03/26/2021   10:46 AM  PHQ 2/9 Scores  PHQ - 2 Score 0 0  PHQ- 9 Score 5     {VISON DENTAL STD PSA (Optional):27386}  {History (Optional):23778}  Patient Care Team: William Belcher, NP as PCP - General (Nurse Practitioner)   Outpatient Medications Prior to Visit  Medication Sig   fluticasone (FLONASE) 50 MCG/ACT nasal spray Place 2 sprays into both nostrils in the morning and at bedtime. After 7 days, reduce to once daily.   norgestimate-ethinyl estradiol (SPRINTEC 28) 0.25-35 MG-MCG tablet Take 1 tablet by mouth daily.   Nystatin POWD Apply liberally to affected area 2 times per day   spironolactone (ALDACTONE) 100 MG tablet Take 1 tablet (100 mg total) by mouth daily.   No facility-administered medications prior to visit.    ROS        Objective:     There were no vitals taken for this visit. {Vitals History (Optional):23777}  Physical Exam   No results found for any visits on 06/10/22. {Show previous labs (optional):23779}    Assessment & Plan:    Routine Health Maintenance and Physical Exam  Immunization History  Administered Date(s) Administered   DTaP 11/11/1999, 01/07/2000,  03/17/2000, 12/01/2000, 06/16/2004   Hepatitis A 04/12/2008, 04/18/2009   Hepatitis B 08/29/1999, 10/06/1999, 03/17/2000   HiB (PRP-OMP) 11/11/1999, 01/07/2000, 03/17/2000, 12/01/2000   IPV 11/11/1999, 01/07/2000, 09/05/2000, 06/16/2004   Influenza,inj,Quad PF,6+ Mos 07/19/2014   Influenza-Unspecified 10/18/2012   MMR 09/05/2001, 06/16/2004   Meningococcal Polysaccharide 04/17/2012   Pneumococcal Conjugate-13 12/01/2000   Pneumococcal-Unspecified 03/17/2000, 05/31/2000   Tdap 04/17/2012   Varicella 09/05/2000, 04/12/2008    Health Maintenance  Topic Date Due   HIV Screening  Never done   Hepatitis C Screening  Never done   INFLUENZA VACCINE  06/08/2022   PAP-Cervical Cytology Screening  06/10/2022 (Originally 08/27/2020)   PAP SMEAR-Modifier  06/10/2022 (Originally 08/27/2020)   TETANUS/TDAP  06/10/2022 (Originally 04/17/2022)   HPV VACCINES  Discontinued   COVID-19 Vaccine  Discontinued    Discussed health benefits of physical activity, and encouraged her to engage in regular exercise appropriate for her age and condition.  Problem List Items Addressed This Visit   None Visit Diagnoses     Annual physical exam    -  Primary   Cervical cancer screening       Need for Tdap vaccination          No follow-ups on file.     Vesper Trant, NP   

## 2021-10-12 DIAGNOSIS — E1065 Type 1 diabetes mellitus with hyperglycemia: Secondary | ICD-10-CM | POA: Diagnosis not present

## 2021-10-21 DIAGNOSIS — E1169 Type 2 diabetes mellitus with other specified complication: Secondary | ICD-10-CM | POA: Diagnosis not present

## 2021-10-21 DIAGNOSIS — E1065 Type 1 diabetes mellitus with hyperglycemia: Secondary | ICD-10-CM | POA: Diagnosis not present

## 2021-10-21 DIAGNOSIS — Z6831 Body mass index (BMI) 31.0-31.9, adult: Secondary | ICD-10-CM | POA: Diagnosis not present

## 2021-10-21 DIAGNOSIS — E291 Testicular hypofunction: Secondary | ICD-10-CM | POA: Diagnosis not present

## 2021-10-21 DIAGNOSIS — E669 Obesity, unspecified: Secondary | ICD-10-CM | POA: Diagnosis not present

## 2021-10-21 DIAGNOSIS — E785 Hyperlipidemia, unspecified: Secondary | ICD-10-CM | POA: Diagnosis not present

## 2021-11-10 ENCOUNTER — Telehealth (INDEPENDENT_AMBULATORY_CARE_PROVIDER_SITE_OTHER): Payer: Medicare Other | Admitting: Physician Assistant

## 2021-11-10 ENCOUNTER — Encounter: Payer: Self-pay | Admitting: Physician Assistant

## 2021-11-10 VITALS — Temp 99.0°F

## 2021-11-10 DIAGNOSIS — U071 COVID-19: Secondary | ICD-10-CM

## 2021-11-10 DIAGNOSIS — J45909 Unspecified asthma, uncomplicated: Secondary | ICD-10-CM | POA: Diagnosis not present

## 2021-11-10 MED ORDER — LATANOPROST 0.005 % OP SOLN: 1.0000 [drp] | Freq: Every day | OPHTHALMIC | 0 refills | Status: DC

## 2021-11-10 MED ORDER — NIRMATRELVIR/RITONAVIR (PAXLOVID)TABLET
3.0000 | ORAL_TABLET | Freq: Two times a day (BID) | ORAL | 0 refills | Status: AC
Start: 2021-11-10 — End: 2021-11-15

## 2021-11-10 MED ORDER — ALBUTEROL SULFATE HFA 108 (90 BASE) MCG/ACT IN AERS: INHALATION_SPRAY | RESPIRATORY_TRACT | 0 refills | Status: DC
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
## 2021-11-10 NOTE — Progress Notes (Signed)
Pt state he was tested on Sunday and was positive  Sore throat raw throat sinus drainage  Cough mucus Fevers was 99 Pt need a rx refill on his albuterol pump

## 2021-11-11 ENCOUNTER — Encounter: Payer: Self-pay | Admitting: Physician Assistant

## 2021-11-15 DIAGNOSIS — U071 COVID-19: Secondary | ICD-10-CM | POA: Diagnosis not present

## 2021-11-15 DIAGNOSIS — Z20822 Contact with and (suspected) exposure to covid-19: Secondary | ICD-10-CM | POA: Diagnosis not present

## 2021-11-17 DIAGNOSIS — Z03818 Encounter for observation for suspected exposure to other biological agents ruled out: Secondary | ICD-10-CM | POA: Diagnosis not present

## 2021-11-17 DIAGNOSIS — Z20822 Contact with and (suspected) exposure to covid-19: Secondary | ICD-10-CM | POA: Diagnosis not present

## 2021-11-22 DIAGNOSIS — Z03818 Encounter for observation for suspected exposure to other biological agents ruled out: Secondary | ICD-10-CM | POA: Diagnosis not present

## 2021-11-22 DIAGNOSIS — Z20822 Contact with and (suspected) exposure to covid-19: Secondary | ICD-10-CM | POA: Diagnosis not present

## 2021-12-15 DIAGNOSIS — H2511 Age-related nuclear cataract, right eye: Secondary | ICD-10-CM | POA: Insufficient documentation

## 2021-12-15 DIAGNOSIS — H52223 Regular astigmatism, bilateral: Secondary | ICD-10-CM | POA: Diagnosis not present

## 2021-12-15 DIAGNOSIS — H401131 Primary open-angle glaucoma, bilateral, mild stage: Secondary | ICD-10-CM | POA: Diagnosis not present

## 2021-12-15 DIAGNOSIS — H2512 Age-related nuclear cataract, left eye: Secondary | ICD-10-CM | POA: Insufficient documentation

## 2021-12-15 DIAGNOSIS — H52229 Regular astigmatism, unspecified eye: Secondary | ICD-10-CM | POA: Insufficient documentation

## 2021-12-26 DIAGNOSIS — J029 Acute pharyngitis, unspecified: Secondary | ICD-10-CM | POA: Diagnosis not present

## 2021-12-26 DIAGNOSIS — J069 Acute upper respiratory infection, unspecified: Secondary | ICD-10-CM | POA: Insufficient documentation

## 2021-12-26 DIAGNOSIS — J3489 Other specified disorders of nose and nasal sinuses: Secondary | ICD-10-CM | POA: Insufficient documentation

## 2021-12-26 DIAGNOSIS — R059 Cough, unspecified: Secondary | ICD-10-CM | POA: Diagnosis not present

## 2021-12-26 DIAGNOSIS — R0981 Nasal congestion: Secondary | ICD-10-CM | POA: Diagnosis not present

## 2022-01-04 DIAGNOSIS — N528 Other male erectile dysfunction: Secondary | ICD-10-CM | POA: Diagnosis not present

## 2022-01-04 DIAGNOSIS — E669 Obesity, unspecified: Secondary | ICD-10-CM | POA: Insufficient documentation

## 2022-02-03 DIAGNOSIS — H52221 Regular astigmatism, right eye: Secondary | ICD-10-CM | POA: Diagnosis not present

## 2022-02-03 DIAGNOSIS — H401131 Primary open-angle glaucoma, bilateral, mild stage: Secondary | ICD-10-CM | POA: Diagnosis not present

## 2022-02-03 DIAGNOSIS — H401111 Primary open-angle glaucoma, right eye, mild stage: Secondary | ICD-10-CM | POA: Diagnosis not present

## 2022-02-03 DIAGNOSIS — H2511 Age-related nuclear cataract, right eye: Secondary | ICD-10-CM | POA: Diagnosis not present

## 2022-02-04 DIAGNOSIS — E1065 Type 1 diabetes mellitus with hyperglycemia: Secondary | ICD-10-CM | POA: Diagnosis not present

## 2022-02-04 DIAGNOSIS — H2512 Age-related nuclear cataract, left eye: Secondary | ICD-10-CM | POA: Diagnosis not present

## 2022-02-10 DIAGNOSIS — H33031 Retinal detachment with giant retinal tear, right eye: Secondary | ICD-10-CM | POA: Diagnosis not present

## 2022-02-22 DIAGNOSIS — E1065 Type 1 diabetes mellitus with hyperglycemia: Secondary | ICD-10-CM | POA: Diagnosis not present

## 2022-02-22 DIAGNOSIS — E291 Testicular hypofunction: Secondary | ICD-10-CM | POA: Diagnosis not present

## 2022-02-22 DIAGNOSIS — Z6831 Body mass index (BMI) 31.0-31.9, adult: Secondary | ICD-10-CM | POA: Diagnosis not present

## 2022-02-22 DIAGNOSIS — E669 Obesity, unspecified: Secondary | ICD-10-CM | POA: Diagnosis not present

## 2022-02-22 LAB — HEMOGLOBIN A1C: Hemoglobin A1C: 5.9

## 2022-02-24 DIAGNOSIS — H2512 Age-related nuclear cataract, left eye: Secondary | ICD-10-CM | POA: Diagnosis not present

## 2022-02-24 DIAGNOSIS — H401121 Primary open-angle glaucoma, left eye, mild stage: Secondary | ICD-10-CM | POA: Diagnosis not present

## 2022-02-24 DIAGNOSIS — H52222 Regular astigmatism, left eye: Secondary | ICD-10-CM | POA: Diagnosis not present

## 2022-03-09 DIAGNOSIS — Z961 Presence of intraocular lens: Secondary | ICD-10-CM | POA: Insufficient documentation

## 2022-03-15 DIAGNOSIS — H524 Presbyopia: Secondary | ICD-10-CM | POA: Diagnosis not present

## 2022-04-06 ENCOUNTER — Encounter: Payer: Medicare Other | Admitting: Osteopathic Medicine

## 2022-04-12 NOTE — Progress Notes (Unsigned)
Complete physical exam  Patient: William Bean   DOB: 06/09/1956   66 y.o. Male  MRN: [id number]  Subjective:    No chief complaint on file.   William Bean is a 66 y.o. male who presents today for a complete physical exam. He reports consuming a {diet types:17450} diet. {types:19826} He generally feels {DESC; WELL/FAIRLY WELL/POORLY:18703}. He reports sleeping {DESC; WELL/FAIRLY WELL/POORLY:18703}. He {does/does not:200015} have additional problems to discuss today.    Most recent fall risk assessment:    03/30/2021    9:20 AM  [place] in the past year? 0  Number falls in past yr: 0  Injury with Fall? 0  Risk for fall due to : No Fall Risks  Follow up Falls evaluation completed     Most recent depression screenings:    03/30/2021    9:20 AM 07/02/2019   10:27 AM  PHQ 2/9 Scores  PHQ - 2 Score 0 0  PHQ- 9 Score  8    {VISON DENTAL STD PSA (Optional):27386}  {History (Optional):23778}  No care team member to display   Outpatient Medications Prior to Visit  Medication Sig   albuterol (VENTOLIN HFA) 108 (90 Base) MCG/ACT inhaler Inhale two puffs every 4-6 hours only as needed for shortness of breath or wheezing.   allopurinol (ZYLOPRIM) 100 MG tablet Take 1 tablet (100 mg total) by mouth daily as needed (As Needed).   AMBULATORY NON FORMULARY MEDICATION Glucometer: Verio One Touch.  Use to check blood sugar up to three times a day.   aspirin 81 MG tablet Take 81 mg by mouth daily.   atorvastatin (LIPITOR) 40 MG tablet TAKE ONE TABLET BY MOUTH DAILY   co-enzyme Q-10 30 MG capsule Take 30 mg by mouth daily.   diazepam (VALIUM) 2 MG tablet Take 1 tablet (2 mg total) by mouth as needed for anxiety (Take 1 tab 5-15 minutes prior to MRI appointment. Can take a 2nd tablet as needed after 5-30 minutes.).   glucose blood (ONETOUCH VERIO) test strip 1 each by Other route 2 (two) times daily. And lancets 2/day 250.01   hyoscyamine (LEVSIN SL) 0.125 MG SL tablet Place  1 tablet (0.125 mg total) under the tongue every 6 (six) hours as needed.   insulin NPH Human (HUMULIN N,NOVOLIN N) 100 UNIT/ML injection Inject 42 Units into the skin 2 (two) times daily.    latanoprost (XALATAN) 0.005 % ophthalmic solution Place 1 drop into both eyes at bedtime.   lisinopril (ZESTRIL) 5 MG tablet TAKE ONE TABLET BY MOUTH DAILY   Multiple Vitamin (MULTIVITAMIN) tablet Take 1 tablet by mouth daily.    Omega-3 Fatty Acids (FISH OIL) 500 MG CAPS Take 2 tablets by mouth daily.    sucralfate (CARAFATE) 1 g tablet Take 1 tablet (1 g total) by mouth at bedtime.   No facility-administered medications prior to visit.    ROS        Objective:     There were no vitals taken for this visit. {Vitals History (Optional):23777}  Physical Exam   No results found for any visits on 04/13/22. {Show previous labs (optional):23779}    Assessment & Plan:    Routine Health Maintenance and Physical Exam  Immunization History  Administered Date(s) Administered   DTaP 04/10/2010   Fluad Quad(high Dose 65+) 07/02/2019   Influenza, High Dose Seasonal PF 07/18/2020   Influenza,inj,Quad PF,6+ Mos 08/12/2014, 09/24/2015, 09/16/2016, 12/27/2017, 09/18/2018   Influenza-Unspecified 09/08/2009   PFIZER(Purple Top)SARS-COV-2 Vaccination 11/23/2019, 12/14/2019  PNEUMOCOCCAL CONJUGATE-20 03/30/2021   Pneumococcal Polysaccharide-23 01/24/2014, 03/24/2020   Td 04/16/2017   Tdap 01/24/2014   Zoster Recombinat (Shingrix) 07/18/2020, 11/27/2020    Health Maintenance  Topic Date Due   OPHTHALMOLOGY EXAM  07/11/2015   FOOT EXAM  03/09/2019   COVID-19 Vaccine (3 - Booster for Pfizer series) 02/08/2020   HEMOGLOBIN A1C  09/30/2021   INFLUENZA VACCINE  06/08/2022   COLONOSCOPY (Pts 45-44yr Insurance coverage will need to be confirmed)  09/09/2022   TETANUS/TDAP  04/17/2027   Pneumonia Vaccine 68 Years old  Completed   Hepatitis C Screening  Completed   Zoster Vaccines- Shingrix   Completed   HPV VACCINES  Aged Out    Discussed health benefits of physical activity, and encouraged him to engage in regular exercise appropriate for his age and condition.  Problem List Items Addressed This Visit       Endocrine   Type 2 diabetes mellitus ([place] (Chronic)     Other   Hyperlipidemia (Chronic)   Other Visit Diagnoses     Encounter to establish care    -  Primary   Annual physical exam       Prostate cancer screening          No follow-ups on file.     JSamuel Bouche NP

## 2022-04-13 ENCOUNTER — Encounter: Payer: Self-pay | Admitting: Medical-Surgical

## 2022-04-13 ENCOUNTER — Ambulatory Visit (INDEPENDENT_AMBULATORY_CARE_PROVIDER_SITE_OTHER): Payer: Medicare Other | Admitting: Medical-Surgical

## 2022-04-13 ENCOUNTER — Encounter: Payer: Medicare Other | Admitting: Osteopathic Medicine

## 2022-04-13 VITALS — BP 133/71 | HR 70 | Resp 20 | Ht 71.5 in | Wt 222.0 lb

## 2022-04-13 DIAGNOSIS — Z0000 Encounter for general adult medical examination without abnormal findings: Secondary | ICD-10-CM | POA: Diagnosis not present

## 2022-04-13 DIAGNOSIS — E7800 Pure hypercholesterolemia, unspecified: Secondary | ICD-10-CM | POA: Diagnosis not present

## 2022-04-13 DIAGNOSIS — E119 Type 2 diabetes mellitus without complications: Secondary | ICD-10-CM

## 2022-04-13 DIAGNOSIS — Z7689 Persons encountering health services in other specified circumstances: Secondary | ICD-10-CM | POA: Diagnosis not present

## 2022-04-13 DIAGNOSIS — Z125 Encounter for screening for malignant neoplasm of prostate: Secondary | ICD-10-CM | POA: Diagnosis not present

## 2022-04-13 MED ORDER — LISINOPRIL 5 MG PO TABS: 5.0000 mg | ORAL_TABLET | Freq: Every day | ORAL | 1 refills | Status: DC

## 2022-04-13 MED ORDER — ATORVASTATIN CALCIUM 40 MG PO TABS: 40.0000 mg | ORAL_TABLET | Freq: Every day | ORAL | 1 refills | Status: DC

## 2022-04-13 NOTE — Assessment & Plan Note (Signed)
Currently having his diabetes managed at [facility] medical clinic.  Requesting records.

## 2022-04-13 NOTE — Assessment & Plan Note (Signed)
Requesting records for recent lab work that was drawn with endocrinology.  Up-to-date on preventative care.  Wellness information provided with AVS.

## 2022-04-13 NOTE — Assessment & Plan Note (Signed)
Requesting records of recent lab results.  Continue Lipitor 40 mg daily as prescribed.

## 2022-04-13 NOTE — Assessment & Plan Note (Signed)
Discussed prostate screening using PSA.  We will request his records and if this has not been done, we will order this to be done.

## 2022-04-14 ENCOUNTER — Other Ambulatory Visit: Payer: Self-pay | Admitting: Physician Assistant

## 2022-04-14 ENCOUNTER — Encounter: Payer: Self-pay | Admitting: Medical-Surgical

## 2022-04-15 MED ORDER — FLUOCINOLONE ACETONIDE BODY 0.01 % EX OIL
1.0000 | TOPICAL_OIL | Freq: Two times a day (BID) | CUTANEOUS | 2 refills | Status: DC | PRN
Start: 2022-04-15 — End: 2023-11-13

## 2022-06-09 ENCOUNTER — Telehealth: Payer: Self-pay

## 2022-06-09 NOTE — Telephone Encounter (Signed)
LMVM for the patient to contact the office.  Patient left message stating that he woke up this morning feeling like he was stuck to an electrical outlet. So, he had his wife to use the BP cuff and it showed that he had an irregular heartbeat.

## 2022-06-10 ENCOUNTER — Other Ambulatory Visit: Payer: Self-pay | Admitting: Medical-Surgical

## 2022-06-10 DIAGNOSIS — N181 Chronic kidney disease, stage 1: Secondary | ICD-10-CM

## 2022-06-10 DIAGNOSIS — H33031 Retinal detachment with giant retinal tear, right eye: Secondary | ICD-10-CM | POA: Diagnosis not present

## 2022-06-10 DIAGNOSIS — E119 Type 2 diabetes mellitus without complications: Secondary | ICD-10-CM

## 2022-06-10 DIAGNOSIS — E7800 Pure hypercholesterolemia, unspecified: Secondary | ICD-10-CM

## 2022-06-10 DIAGNOSIS — E109 Type 1 diabetes mellitus without complications: Secondary | ICD-10-CM | POA: Diagnosis not present

## 2022-06-10 DIAGNOSIS — I10 Essential (primary) hypertension: Secondary | ICD-10-CM

## 2022-06-10 DIAGNOSIS — H401131 Primary open-angle glaucoma, bilateral, mild stage: Secondary | ICD-10-CM | POA: Diagnosis not present

## 2022-06-10 LAB — HM DIABETES EYE EXAM

## 2022-06-10 NOTE — Telephone Encounter (Signed)
In regards to his irregular heartbeat, he will need to come in for an appointment so that we can discuss this further and complete an EKG.  If he begins to have chest pain/shortness of breath, dizziness, syncopal episodes, diaphoresis, nausea, etc., he should report to the nearest ED or urgent care for immediate evaluation.  Referral placed for dietitian.  ___________________________________________ Clearnce Sorrel, DNP, APRN, FNP-BC Primary Care and [place]

## 2022-06-10 NOTE — Telephone Encounter (Signed)
Patient called to let you know that he has lost some pounds and now going to [employer] to start an exercise regimen. He wants a referral to a Diabetes specialist or nutritionist.

## 2022-06-11 NOTE — Telephone Encounter (Signed)
LVM for patient to call back to get appt scheduled. AMUCK

## 2022-06-22 DIAGNOSIS — E1065 Type 1 diabetes mellitus with hyperglycemia: Secondary | ICD-10-CM | POA: Diagnosis not present

## 2022-06-22 DIAGNOSIS — E1165 Type 2 diabetes mellitus with hyperglycemia: Secondary | ICD-10-CM | POA: Diagnosis not present

## 2022-06-22 DIAGNOSIS — E663 Overweight: Secondary | ICD-10-CM | POA: Diagnosis not present

## 2022-06-22 DIAGNOSIS — E785 Hyperlipidemia, unspecified: Secondary | ICD-10-CM | POA: Diagnosis not present

## 2022-06-22 DIAGNOSIS — Z6828 Body mass index (BMI) 28.0-28.9, adult: Secondary | ICD-10-CM | POA: Diagnosis not present

## 2022-06-30 ENCOUNTER — Encounter: Payer: Self-pay | Admitting: General Practice

## 2022-07-02 ENCOUNTER — Encounter: Payer: Self-pay | Admitting: *Deleted

## 2022-07-02 ENCOUNTER — Ambulatory Visit: Payer: Self-pay | Admitting: *Deleted

## 2022-07-02 NOTE — Patient Outreach (Signed)
  Care Coordination   07/02/2022 Name: Prophet Renwick MRN: [id number] DOB: May 12, 1956   Care Coordination Outreach Attempts:  An unsuccessful telephone outreach was attempted today to offer the patient information about available care coordination services as a benefit of their health plan.   Follow Up Plan:  Additional outreach attempts will be made to offer the patient care coordination information and services.   Encounter Outcome:  No Answer left voice message requesting call back  Care Coordination Interventions Activated:  No   Care Coordination Interventions:  No, not indicated Unsuccessful outreach attempt    Oneta Rack, RN, BSN, CCRN Alumnus RN CM Care Coordination/ Transition of [place] Management [phone]: direct office

## 2022-07-07 ENCOUNTER — Ambulatory Visit: Payer: Self-pay | Admitting: *Deleted

## 2022-07-07 ENCOUNTER — Encounter: Payer: Self-pay | Admitting: *Deleted

## 2022-07-07 NOTE — Patient Outreach (Signed)
  Care Coordination   Initial Visit Note   07/07/2022 Name: William Bean MRN: [id number] DOB: 03-Dec-1955  William Bean is a 66 y.o. year old male who sees William Bouche, NP for primary care. I spoke with  William Bean by phone today.  What matters to the patients health and wellness today?  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 and will call to schedule the medicare AWV visit soon"  No ongoing care coordination needs identified today   Goals Addressed             This Visit's Progress    COMPLETED: Care Coordination Activites: No follow up required   On track    Care Coordination Interventions: Evaluation of current treatment plan related to DMII and patient's adherence to plan as established by provider Advised patient to inform PCP if he experiences any further concerning episodes/ signs- symptoms irregular heart beat or generalized shaking like he did in June 2023- he reports this episode spontaneously resolved within 3-5 minutes and has not happened since Provided education to patient re: signs/ symptoms hypoglycemia and corresponding action plan: patient verbalizes very good understanding of same; reports no recent hypoglycemic episodes recently; uses CGM, and has had recently had visit with endocrinologist- reports most recent A1-C of 5.9- verbalizes excellent understanding of self-management of DMII Reviewed medications with patient and discussed current use of insulin Reviewed scheduled/upcoming provider appointments including DEC/ nutritional visit 08/05/22 Advised patient to discuss ongoing use of  insulin dosing- he continues using and confirms he regularly has visits  with provider Assessed social determinant of health barriers Discussed purpose and value of Medicare Annual Wellness Visit- he declines scheduling at this time, 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 No ongoing care coordination needs identified  Encounter Outcome:  Pt. Visit Completed   William Rack, RN, BSN, CCRN Alumnus RN CM Care Coordination/ Transition of [place] Management [phone]: direct office

## 2022-07-15 NOTE — Progress Notes (Deleted)
   Acute Office Visit  Subjective:     Patient ID: William Bean, male    DOB: 28-Jan-1956, 66 y.o.   MRN: [id number]  No chief complaint on file.   HPI Patient is in today for tooth pain.   ROS      Objective:    There were no vitals taken for this visit. {Vitals History (Optional):23777}  Physical Exam  No results found for any visits on 07/16/22.      Assessment & Plan:   Problem List Items Addressed This Visit   None   No orders of the defined types were placed in this encounter.   No follow-ups on file.  Owens Loffler, DO

## 2022-07-16 ENCOUNTER — Ambulatory Visit: Payer: Medicare Other | Admitting: Family Medicine

## 2022-08-05 ENCOUNTER — Ambulatory Visit: Payer: Medicare Other | Admitting: Skilled Nursing Facility1

## 2022-09-08 ENCOUNTER — Ambulatory Visit: Payer: Medicare Other | Admitting: Skilled Nursing Facility1

## 2022-09-15 ENCOUNTER — Encounter: Payer: Self-pay | Admitting: Gastroenterology

## 2022-09-20 ENCOUNTER — Ambulatory Visit: Payer: Medicare Other | Admitting: Skilled Nursing Facility1

## 2022-09-22 ENCOUNTER — Encounter: Payer: Self-pay | Admitting: Medical-Surgical

## 2022-09-22 ENCOUNTER — Ambulatory Visit (INDEPENDENT_AMBULATORY_CARE_PROVIDER_SITE_OTHER): Payer: Medicare Other | Admitting: Medical-Surgical

## 2022-09-22 VITALS — BP 112/65 | HR 77 | Resp 20 | Ht 71.5 in | Wt 218.9 lb

## 2022-09-22 DIAGNOSIS — L989 Disorder of the skin and subcutaneous tissue, unspecified: Secondary | ICD-10-CM

## 2022-09-22 DIAGNOSIS — L731 Pseudofolliculitis barbae: Secondary | ICD-10-CM

## 2022-09-22 DIAGNOSIS — L821 Other seborrheic keratosis: Secondary | ICD-10-CM | POA: Diagnosis not present

## 2022-09-22 MED ORDER — FLUOCINOLONE ACETONIDE SCALP 0.01 % EX OIL: 1.0000 | TOPICAL_OIL | Freq: Two times a day (BID) | CUTANEOUS | 1 refills | Status: AC

## 2022-09-22 NOTE — Progress Notes (Signed)
Established Patient Office Visit  Subjective   Patient ID: William Bean, male   DOB: 20-Oct-1956 Age: 66 y.o. MRN: [id number]   Chief Complaint  Patient presents with   spots on face   HPI Pleasant 66 year old male presenting today for evaluation of several skin issues.   He has had a spot under his right eye for several years. Notes that the spot is very small and varies in the degree of redness. Reports it is the size of a BB and has not changed in size or shape. He has tried squeezing it and is not able to express any fluid. Not tender. After squeezing it, noted that it has a small line through it but no open spots or discharge.   Has several spots on his sideburns that his wife noticed recently. She had never seen or paid attention to them before but remarked that they have been present over the last six months. Not itchy or painful.   Long history of "beard crud". Has worn a beard and moustache for most of his life and reports that he has to fight a rash regularly. Will use his fingernails to "tear it down" but then it gets hardened and starts to shed big flakes of skin. Started in a small area but has now spread to encompass the entire beard area. Has tried a red liquid that he has at home without benefit. Also having his eyebrows affected. Over the years, has tried many agents and interventions but no improvement. Last dermatology visit was about 6 years ago.    Objective:    Vitals:   09/22/22 1058 09/22/22 1154  BP: (!) 146/66 112/65  Pulse: 91 77  Resp: 20 20  Height: 5' 11.5" (1.816 m)   Weight: 218 lb 14.4 oz (99.3 kg)   SpO2: 99% 98%  BMI (Calculated): 30.11     Physical Exam Vitals reviewed.  Constitutional:      General: He is not in acute distress.    Appearance: Normal appearance. He is obese. He is not ill-appearing.  HENT:     Head: Normocephalic.   Cardiovascular:     Rate and Rhythm: Normal rate.     Pulses: Normal pulses.     Heart sounds: Normal  heart sounds. No murmur heard.    No friction rub. No gallop.  Pulmonary:     Effort: Pulmonary effort is normal. No respiratory distress.     Breath sounds: Normal breath sounds.  Skin:    General: Skin is warm and dry.  Neurological:     Mental Status: He is alert and oriented to person, place, and time.  Psychiatric:        Mood and Affect: Mood normal.        Behavior: Behavior normal.        Thought Content: Thought content normal.        Judgment: Judgment normal.   No results found for this or any previous visit (from the past 24 hour(s)).     The ASCVD Risk score (Arnett DK, et al., 2019) failed to calculate for the following reasons:   The valid total cholesterol range is 130 to 320 mg/dL   Assessment & Plan:   1. Skin lesion Unclear etiology. No change in appearance, size, shape, or color. Blanchability points toward a small telangiectasia but can't rule out basal cell lesion. Cryotherapy performed to the site with three passes. Patient tolerated well.   2. Seborrheic keratoses Benign lesions that are  cosmetically bothersome.  Procedure: Cryodestruction of: 4 seborrheic keratoses (5 lesions total) Consent obtained and verified. Time-out conducted. Noted no overlying erythema, induration, or other signs of local infection. Completed without difficulty using Cryo-Gun. Advised to call if fevers/chills, erythema, induration, drainage, or persistent bleeding.  3. Pseudofolliculitis of the beard Start Derma-Smoothe 0.01% oil to the beard area twice daily for up to 14 days. If no improvement, consider Benzaclin, Retin-A, or possibly Ketoconazole shampoo (for possible fungal etiology).   Return if symptoms worsen or fail to improve.  ___________________________________________ Clearnce Sorrel, DNP, APRN, FNP-BC Primary Care and [place]

## 2022-09-27 ENCOUNTER — Encounter: Payer: Self-pay | Admitting: Skilled Nursing Facility1

## 2022-09-27 ENCOUNTER — Encounter: Payer: Medicare Other | Attending: Medical-Surgical | Admitting: Skilled Nursing Facility1

## 2022-09-27 VITALS — Ht 61.0 in | Wt 217.1 lb

## 2022-09-27 DIAGNOSIS — E119 Type 2 diabetes mellitus without complications: Secondary | ICD-10-CM | POA: Insufficient documentation

## 2022-09-27 NOTE — Progress Notes (Signed)
DM medications: Libre 2 CGM 1 25 units humulin stating he does not want to take as much as prescribed. States he takes 15 units some days along with the 25 units if he has a low blood sugar. States he takes novolin R once a week when he sees a high blood sugar number  Pt states he does have ups and downs with his numbers. Pt states he is understanding the insulin regiment is causing the yoyo'ing. Pt states he started going to [employer] 2-3 times a week to regain muscle mass. Last A1C from 03/30/2021 7.5. Pt states he does use his CGM and checks his blood sugars often:  Pt states from his CGM he has learned: he does not have to be perfect with his diet all the time and to not consume no more than 15-17 grams of carbs and not check until 15 minutes later (when having a low). Pt states he has a low 1-2 times a week Pt states he drinks 150 ounces diet soda stating his goal is to drink green tea that is decaf with Stevia.  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  Pt states he was diagnosed as having type 1 diabetes.  Pt states he often goes long periods of time without eating (from breakfast to mid or late afternoon) Pt states his endocrinologist is in [facility]. Possible reason for limited information on fast acting insulin and diagnosis of type 1 diabetes.  Educated on the how blood sugar and insulin work in the body. Discussed how food and exercise effects blood sugar. Gave handouts on general healthful diet to give pt ideas of well rounded meals to be thinking about prior to his next appointment as it was not discussed due to pathophysiology and insulin management due to weekly low blood sugar. Dietitian encouraged pt to have an open conversation with endocrinologist about proper insulin management due to weekly hypoglycemia.  Other  Med: Atorvastatin Asprin Lisinopril  Supplements: Tumeric Cinnamon Apple cider vinegar  Juice  Adult mens multivitamin Ultra vitamin B Vitamin D   Other Dx: GERD Cancer: melanoma  Asthma Hyperlipidemia HTN Stroke Crohns  Kidney donation   Body Composition Scale 09/27/2022  Current Body Weight 217.1  Total Body Fat % 26.8  Visceral Fat 17  Fat-Free Mass % 73.1   Total Body Water % 54.1  Muscle-Mass lbs 40.8  BMI 30.1  Body Fat Displacement          Torso  lbs 36         Left Leg  lbs 7.2         Right Leg  lbs 7.2         Left Arm  lbs 3.6         Right Arm   lbs 3.6    Goals: Shift from current drinks to decaf green tea Do 3 10 minute intervals of activity on days you are not going to the gym Review materials given and come with any questions related to diet Diabetes Self-Management Education  Visit Type: First/Initial   09/27/2022  William Bean, identified by name and date of birth, is a 66 y.o. male with a diagnosis of Diabetes: Type 2.   ASSESSMENT  Height '5\' 1"'$  (1.549 m), weight 217 lb 1.6 oz (98.5 kg). Body mass index is 41.02 kg/m.   Diabetes Self-Management Education - 09/27/22 [phone]  Visit Information   Visit Type First/Initial      Initial Visit   Diabetes Type Type 2    Are you currently following a meal plan? No    Are you taking your medications as prescribed? No      Health Coping   How would you rate your overall health? Good      Psychosocial Assessment   Patient Belief/Attitude about Diabetes Motivated to manage diabetes    What is the hardest part about your diabetes right now, causing you the most concern, or is the most worrisome to you about your diabetes?   Making healty food and beverage choices    Self-care barriers None    Self-management support Family;Friends    Patient Concerns Nutrition/Meal planning;Healthy Lifestyle    Special Needs None    Preferred Learning Style Visual;Auditory    Learning  Readiness Contemplating    How often do you need to have someone help you when you read instructions, pamphlets, or other written materials from your doctor or pharmacy? 2 - Rarely    What is the last grade level you completed in school? doctorate      Pre-Education Assessment   Patient understands the diabetes disease and treatment process. Needs Instruction    Patient understands incorporating nutritional management into lifestyle. Needs Instruction    Patient undertands incorporating physical activity into lifestyle. Needs Instruction    Patient understands using medications safely. Needs Instruction    Patient understands monitoring blood glucose, interpreting and using results Needs Instruction    Patient understands prevention, detection, and treatment of acute complications. Needs Instruction    Patient understands prevention, detection, and treatment of chronic complications. Needs Instruction    Patient understands how to develop strategies to address psychosocial issues. Needs Instruction    Patient understands how to develop strategies to promote health/change behavior. Needs Instruction      Complications   Last HgB A1C per patient/outside source 7.5 %    How often do you check your blood sugar? > 4 times/day    Number of hypoglycemic episodes per month 4    Number of hyperglycemic episodes ( >'200mg'$ /dL): Weekly    Have you had a dilated eye exam in the past 12 months? Yes    Have you had a dental exam in the past 12 months? No    Are you checking your feet? Yes    How many days per week are you checking your feet? 2      Activity / Exercise   Activity / Exercise Type Light (walking / raking leaves)    How many days per week do you exercise? 3    How many minutes per day do you exercise? 45    Total minutes per week of exercise 135      Patient Education   Previous Diabetes Education No    Disease Pathophysiology Definition of diabetes, type 1 and 2, and the diagnosis of  diabetes;Factors that contribute to the development of diabetes    Being Active Role of exercise on diabetes management, blood pressure control and cardiac health.    Medications Reviewed patients medication for diabetes, action, purpose, timing of dose and side effects.    Monitoring Interpreting lab values - A1C, lipid, urine microalbumina.;Taught/evaluated CGM (comment)    Acute complications Taught prevention, symptoms, and  treatment of hypoglycemia - the 15 rule.;Discussed and identified patients' prevention, symptoms, and treatment of hyperglycemia.    Chronic complications Relationship between chronic complications and  blood glucose control    Diabetes Stress and Support Role of stress on diabetes;Travel strategies    Lifestyle and Health Coping Lifestyle issues that need to be addressed for better diabetes care      Individualized Goals (developed by patient)   Nutrition General guidelines for healthy choices and portions discussed    Physical Activity 30 minutes per day    Medications take my medication as prescribed    Problem Solving Addressing barriers to behavior change;Medication consistency;Eating Pattern    Reducing Risk examine blood glucose patterns      Post-Education Assessment   Patient understands the diabetes disease and treatment process. Demonstrates understanding / competency    Patient understands incorporating nutritional management into lifestyle. Needs Instruction    Patient undertands incorporating physical activity into lifestyle. Demonstrates understanding / competency    Patient understands using medications safely. Demonstrates understanding / competency    Patient understands monitoring blood glucose, interpreting and using results Demonstrates understanding / competency    Patient understands prevention, detection, and treatment of acute complications. Demonstrates understanding / competency    Patient understands prevention, detection, and treatment of  chronic complications. Demonstrates understanding / competency    Patient understands how to develop strategies to address psychosocial issues. Demonstrates understanding / competency    Patient understands how to develop strategies to promote health/change behavior. Demonstrates understanding / competency      Outcomes   Expected Outcomes Demonstrated interest in learning but significant barriers to change    Future DMSE 4-6 wks    Program Status Completed             Individualized Plan for Diabetes Self-Management Training:   Learning Objective:  Patient will have a greater understanding of diabetes self-management. Patient education plan is to attend individual and/or group sessions per assessed needs and concerns.   Expected Outcomes:  Demonstrated interest in learning but significant barriers to change  Education material provided: ADA - How to Thrive: A Guide for Your Journey with Diabetes, Food label handouts, Meal plan card, My Plate, and Snack sheet  If problems or questions, patient to contact team via:  Phone and Email  Future DSME appointment: 4-6 wks

## 2022-10-21 DIAGNOSIS — E663 Overweight: Secondary | ICD-10-CM | POA: Diagnosis not present

## 2022-10-21 DIAGNOSIS — Z6828 Body mass index (BMI) 28.0-28.9, adult: Secondary | ICD-10-CM | POA: Diagnosis not present

## 2022-10-21 DIAGNOSIS — E1065 Type 1 diabetes mellitus with hyperglycemia: Secondary | ICD-10-CM | POA: Diagnosis not present

## 2022-10-21 DIAGNOSIS — E291 Testicular hypofunction: Secondary | ICD-10-CM | POA: Diagnosis not present

## 2022-10-22 DIAGNOSIS — H401131 Primary open-angle glaucoma, bilateral, mild stage: Secondary | ICD-10-CM | POA: Diagnosis not present

## 2022-10-22 DIAGNOSIS — H26493 Other secondary cataract, bilateral: Secondary | ICD-10-CM | POA: Diagnosis not present

## 2022-10-22 DIAGNOSIS — H538 Other visual disturbances: Secondary | ICD-10-CM | POA: Diagnosis not present

## 2022-10-29 ENCOUNTER — Ambulatory Visit: Admit: 2022-10-29 | Discharge status: Absent without leave | Payer: Medicare Other

## 2022-10-30 ENCOUNTER — Ambulatory Visit
Admission: RE | Admit: 2022-10-30 | Discharge: 2022-10-30 | Disposition: A | Discharge status: Home / Self Care | Payer: Medicare Other | Source: Ambulatory Visit | Attending: Family Medicine | Admitting: Family Medicine

## 2022-10-30 VITALS — BP 126/69 | HR 78 | Temp 98.2°F | Resp 17

## 2022-10-30 DIAGNOSIS — B349 Viral infection, unspecified: Secondary | ICD-10-CM | POA: Diagnosis not present

## 2022-10-30 LAB — POCT INFLUENZA A/B
Influenza A, POC: NEGATIVE
Influenza B, POC: NEGATIVE

## 2022-10-30 LAB — POC SARS CORONAVIRUS 2 AG -  ED: SARS Coronavirus 2 Ag: NEGATIVE
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
## 2022-10-30 NOTE — Discharge Instructions (Signed)
Your COVID test is negative Your influenza test is also negative You have a cold virus that is improving See your doctor for regular visits

## 2022-10-30 NOTE — ED Triage Notes (Signed)
Pt c/o sore throat x 5 days. COVID home neg at home 2 days ago. Denies fever. No OTC meds taken. Wife seen at another UC yesterday, tested neg for everything.

## 2022-11-02 ENCOUNTER — Ambulatory Visit
Admission: EM | Admit: 2022-11-02 | Discharge: 2022-11-02 | Disposition: A | Discharge status: Home / Self Care | Payer: Medicare Other | Attending: Urgent Care | Admitting: Urgent Care

## 2022-11-02 DIAGNOSIS — R0789 Other chest pain: Secondary | ICD-10-CM

## 2022-11-02 DIAGNOSIS — K2289 Other specified disease of esophagus: Secondary | ICD-10-CM | POA: Diagnosis not present

## 2022-11-02 MED ORDER — LIDOCAINE VISCOUS HCL 2 % MT SOLN: 15.0000 mL | Freq: Once | OROMUCOSAL | Status: DC

## 2022-11-02 MED ORDER — ALUM & MAG HYDROXIDE-SIMETH 200-200-20 MG/5ML PO SUSP
30.0000 mL | Freq: Once | ORAL | Status: AC
  Administered 2022-11-02: 30 mL via ORAL

## 2022-11-02 NOTE — Discharge Instructions (Signed)
Your EKG is unremarkable. You were given Maalox/Mylanta with an improvement in your symptoms. This suggests an esophageal spasm or an esophageal pain. You may purchase medication over-the-counter without a prescription. Use on an as-needed basis.
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
## 2022-11-02 NOTE — ED Triage Notes (Signed)
Patient presents to UC for chest pain that started about 45 mins ago after dinner. States pain is located at center of chest and rates it a 1/10 pain level. Has not taken any medications he came directly to UC for eval. Denies cardiac or GI hx.   Denies SOB or dizziness.

## 2022-11-10 ENCOUNTER — Ambulatory Visit: Payer: Medicare Other | Admitting: Skilled Nursing Facility1

## 2022-11-15 ENCOUNTER — Telehealth: Payer: Self-pay | Admitting: Medical-Surgical

## 2022-11-15 ENCOUNTER — Ambulatory Visit (INDEPENDENT_AMBULATORY_CARE_PROVIDER_SITE_OTHER): Payer: Medicare Other | Admitting: Medical-Surgical

## 2022-11-15 ENCOUNTER — Encounter: Payer: Self-pay | Admitting: Medical-Surgical

## 2022-11-15 VITALS — BP 124/71 | HR 85 | Resp 20 | Ht 73.0 in | Wt 219.0 lb

## 2022-11-15 DIAGNOSIS — R109 Unspecified abdominal pain: Secondary | ICD-10-CM

## 2022-11-15 DIAGNOSIS — R1031 Right lower quadrant pain: Secondary | ICD-10-CM

## 2022-11-15 LAB — POCT URINALYSIS DIPSTICK
Bilirubin, UA: NEGATIVE
Blood, UA: NEGATIVE
Glucose, UA: NEGATIVE
Ketones, UA: NEGATIVE
Leukocytes, UA: NEGATIVE
Nitrite, UA: NEGATIVE
Protein, UA: NEGATIVE
Spec Grav, UA: 1.015 (ref 1.010–1.025)
Urobilinogen, UA: 0.2 E.U./dL
pH, UA: 7.5 (ref 5.0–8.0)

## 2022-11-15 NOTE — Progress Notes (Signed)
Established Patient Office Visit  Subjective   Patient ID: William Bean, male   DOB: 1956/07/19 Age: 67 y.o. MRN: [id number]   Chief Complaint  Patient presents with   Flank Pain   HPI Pleasant 67 year old male accompanied by his wife presenting today for complaints of right sided kidney pain.  Notes that this has been going on for approximately a month.  His symptoms started a couple of days after he saw endocrinology.  Reports the pain is rated at most 2/10, aching and comes and goes.  He thought that the problem was drinking too much diet soda and caffeine and has cut back dramatically on this.  No improvement in his symptoms.  He has seen no gross hematuria and has no burning, frequency, urgency, or hesitancy when urinating.  No penile discharge.  Denies risk for STIs.  Has had some issues where he empties his bladder however he has to go again shortly after.  He is a kidney donor to his father and is very aware of the importance of maintaining good kidney health since he only has 1.  No history of kidney stones.  No recent injuries or falls.  Has been working out at [employer] and feels that he pushes hard sometimes and so is not sure if this is a muscle problem in his back or if it is his kidney.   Objective:    Vitals:   11/15/22 1313  BP: 124/71  Pulse: 85  Resp: 20  Height: '6\' 1"'$  (1.854 m)  Weight: 219 lb (99.3 kg)  SpO2: 97%  BMI (Calculated): 28.9   Physical Exam Vitals reviewed.  Constitutional:      General: He is not in acute distress.    Appearance: Normal appearance. He is obese. He is not ill-appearing.  HENT:     Head: Normocephalic.  Cardiovascular:     Rate and Rhythm: Normal rate.     Pulses: Normal pulses.     Heart sounds: Normal heart sounds. No murmur heard.    No friction rub. No gallop.  Pulmonary:     Effort: Pulmonary effort is normal. No respiratory distress.     Breath sounds: Normal breath sounds.  Abdominal:     General: Bowel sounds are  normal. There is no distension. There are no signs of injury.     Palpations: Abdomen is soft. There is no hepatomegaly, splenomegaly or mass.     Tenderness: There is no abdominal tenderness. There is no right CVA tenderness, left CVA tenderness or guarding.  Musculoskeletal:       Arms:  Skin:    General: Skin is warm and dry.  Neurological:     Mental Status: He is alert and oriented to person, place, and time.  Psychiatric:        Mood and Affect: Mood normal.        Behavior: Behavior normal.        Thought Content: Thought content normal.        Judgment: Judgment normal.    Results for orders placed or performed in visit on 11/15/22 (from the past 24 hour(s))  POCT Urinalysis Dipstick     Status: None   Collection Time: 11/15/22  1:56 PM  Result Value Ref Range   Color, UA YELLOW    Clarity, UA CLEAR    Glucose, UA Negative Negative   Bilirubin, UA NEG    Ketones, UA NEG    Spec Grav, UA 1.015 1.010 - 1.025  Blood, UA NEG    pH, UA 7.5 5.0 - 8.0   Protein, UA Negative Negative   Urobilinogen, UA 0.2 0.2 or 1.0 E.U./dL   Nitrite, UA NEG    Leukocytes, UA Negative Negative   Appearance     Odor         The ASCVD Risk score (Arnett DK, et al., 2019) failed to calculate for the following reasons:   The valid total cholesterol range is 130 to 320 mg/dL   Assessment & Plan:   1. Right lower quadrant abdominal pain 2. Right flank pain POCT urinalysis completely normal.  Checking labs as below to evaluate kidney function.  No history of kidney stones but colicky pain could represent an nonobstructing stone.  Ordering CT renal stone study today. - CT RENAL STONE STUDY; Future - POCT Urinalysis Dipstick - CBC with Differential - COMPLETE METABOLIC PANEL WITH GFR  No follow-ups on file.  ___________________________________________ Clearnce Sorrel, DNP, APRN, FNP-BC [facility] and [place]

## 2022-11-15 NOTE — Telephone Encounter (Signed)
Please contact patient to switch this appointment to IN PERSON. Unable to assess "pain in the kidney" virtually.

## 2022-11-16 LAB — CBC WITH DIFFERENTIAL/PLATELET
Absolute Monocytes: 419 cells/uL (ref 200–950)
Basophils Absolute: 57 cells/uL (ref 0–200)
Basophils Relative: 0.8 %
Eosinophils Absolute: 199 cells/uL (ref 15–500)
Eosinophils Relative: 2.8 %
HCT: 42.9 % (ref 38.5–50.0)
Hemoglobin: 14.8 g/dL (ref 13.2–17.1)
Lymphs Abs: 2031 cells/uL (ref 850–3900)
MCH: 29.8 pg (ref 27.0–33.0)
MCHC: 34.5 g/dL (ref 32.0–36.0)
MCV: 86.5 fL (ref 80.0–100.0)
MPV: 11.3 fL (ref 7.5–12.5)
Monocytes Relative: 5.9 %
Neutro Abs: 4395 cells/uL (ref 1500–7800)
Neutrophils Relative %: 61.9 %
Platelets: 177 10*3/uL (ref 140–400)
RBC: 4.96 10*6/uL (ref 4.20–5.80)
RDW: 13.6 % (ref 11.0–15.0)
Total Lymphocyte: 28.6 %
WBC: 7.1 10*3/uL (ref 3.8–10.8)

## 2022-11-16 LAB — COMPLETE METABOLIC PANEL WITH GFR
AG Ratio: 1.7 (calc) (ref 1.0–2.5)
ALT: 38 U/L (ref 9–46)
AST: 46 U/L — ABNORMAL HIGH (ref 10–35)
Albumin: 4.6 g/dL (ref 3.6–5.1)
Alkaline phosphatase (APISO): 90 U/L (ref 35–144)
BUN: 23 mg/dL (ref 7–25)
CO2: 34 mmol/L — ABNORMAL HIGH (ref 20–32)
Calcium: 10.1 mg/dL (ref 8.6–10.3)
Chloride: 103 mmol/L (ref 98–110)
Creat: 1.02 mg/dL (ref 0.70–1.35)
Globulin: 2.7 g/dL (calc) (ref 1.9–3.7)
Glucose, Bld: 77 mg/dL (ref 65–99)
Potassium: 5.4 mmol/L — ABNORMAL HIGH (ref 3.5–5.3)
Sodium: 143 mmol/L (ref 135–146)
Total Bilirubin: 0.8 mg/dL (ref 0.2–1.2)
Total Protein: 7.3 g/dL (ref 6.1–8.1)
eGFR: 80 mL/min/{1.73_m2} (ref >=60)

## 2022-11-18 ENCOUNTER — Encounter: Payer: Self-pay | Admitting: Medical-Surgical

## 2022-11-18 DIAGNOSIS — H33031 Retinal detachment with giant retinal tear, right eye: Secondary | ICD-10-CM | POA: Diagnosis not present

## 2022-11-18 DIAGNOSIS — H401131 Primary open-angle glaucoma, bilateral, mild stage: Secondary | ICD-10-CM | POA: Diagnosis not present

## 2022-11-19 ENCOUNTER — Ambulatory Visit (INDEPENDENT_AMBULATORY_CARE_PROVIDER_SITE_OTHER): Payer: Medicare Other

## 2022-11-19 DIAGNOSIS — R1031 Right lower quadrant pain: Secondary | ICD-10-CM | POA: Diagnosis not present

## 2022-11-19 DIAGNOSIS — I700 Atherosclerosis of aorta: Secondary | ICD-10-CM | POA: Diagnosis not present

## 2022-11-19 DIAGNOSIS — K449 Diaphragmatic hernia without obstruction or gangrene: Secondary | ICD-10-CM | POA: Diagnosis not present

## 2022-11-19 DIAGNOSIS — K429 Umbilical hernia without obstruction or gangrene: Secondary | ICD-10-CM | POA: Diagnosis not present

## 2022-11-19 DIAGNOSIS — R109 Unspecified abdominal pain: Secondary | ICD-10-CM | POA: Diagnosis not present

## 2022-11-19 DIAGNOSIS — K8020 Calculus of gallbladder without cholecystitis without obstruction: Secondary | ICD-10-CM | POA: Diagnosis not present

## 2022-12-20 ENCOUNTER — Ambulatory Visit: Payer: Medicare Other | Admitting: Skilled Nursing Facility1

## 2022-12-23 ENCOUNTER — Ambulatory Visit: Payer: Self-pay | Admitting: Skilled Nursing Facility1

## 2022-12-30 ENCOUNTER — Encounter: Payer: Self-pay | Admitting: Medical-Surgical

## 2023-01-10 IMAGING — NM NM HEPATO W/GB/PHARM/[PERSON_NAME]
3 series · 18 of 18 positions shown · non-contrast
Comparison: August 27, 2020

CLINICAL DATA: Recurrent biliary colic, gallbladder dyskinesia
suspected

EXAM:
NUCLEAR MEDICINE HEPATOBILIARY IMAGING WITH GALLBLADDER EF
TECHNIQUE: Sequential images of the abdomen were obtained [DATE] minutes
following intravenous administration of radiopharmaceutical. After
oral ingestion of Ensure, gallbladder ejection fraction was
determined. At 60 min, normal ejection fraction is greater than 33%.
RADIOPHARMACEUTICALS:  5.5 mCi Mc-PPm  Choletec IV

[Series 1: gbef ensure · 3.28mm/px · 6 of 60 frames shown]
[frame 6/60]
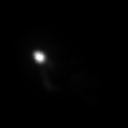
[frame 16/60]
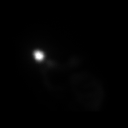
[frame 26/60]
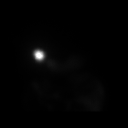
[frame 36/60]
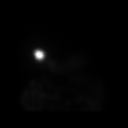
[frame 46/60]
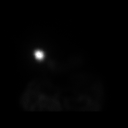
[frame 56/60]
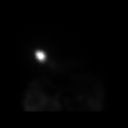

[Series 1: hida scan · 3.28mm/px · 6 of 30 frames shown (1 of 2)]
[frame 3/30]
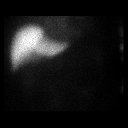
[frame 8/30]
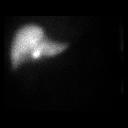
[frame 13/30]
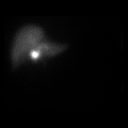
[frame 18/30]
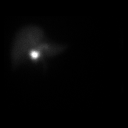
[frame 23/30]
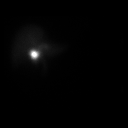
[frame 28/30]
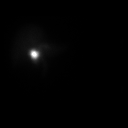

[Series 1: hida scan · 3.28mm/px · 6 of 30 frames shown (2 of 2)]
[frame 3/30]
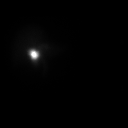
[frame 8/30]
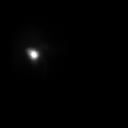
[frame 13/30]
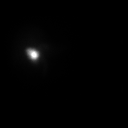
[frame 18/30]
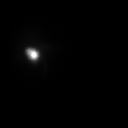
[frame 23/30]
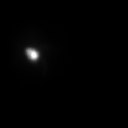
[frame 28/30]
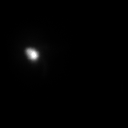

[18 of 18 positions shown; findings below may reference images not displayed]

FINDINGS: There is prompt, uniform radiotracer uptake by the liver with normal
filling of the intrahepatic ducts, common bile duct. Gallbladder
activity is visualized, consistent with patency of cystic duct
(normal < 60 minutes). Additionally there is normal biliary to bowel
transit (normal < 60 minutes), consistent with patent common bile
duct.

Ensure was administered and the gallbladder appears to empty
normally on sequential images. Calculated gallbladder ejection
fraction is 47%. (Normal gallbladder ejection fraction with Ensure
is greater than 33%.)

Enterogastric biliary reflux is present.
IMPRESSION: 1.  Patent cystic and common bile ducts.

2.  Normal gallbladder ejection fraction.

3. Enterogastric biliary reflux, which can be a cause of abdominal
pain.

## 2023-01-27 ENCOUNTER — Telehealth: Payer: Self-pay | Admitting: Medical-Surgical

## 2023-01-27 NOTE — Telephone Encounter (Signed)
Called patient to schedule Medicare Annual Wellness Visit (AWV). Left message for patient to call back and schedule Medicare Annual Wellness Visit (AWV).  Last date of AWV: Never  Please schedule an appointment at any time with PCP or Nurse Health Advisor.  If any questions, please contact me at [phone].  Thank you ,  Lin Givens Patient Access Advocate II Direct Dial: [phone]

## 2023-02-17 DIAGNOSIS — N529 Male erectile dysfunction, unspecified: Secondary | ICD-10-CM | POA: Diagnosis not present

## 2023-02-17 DIAGNOSIS — C61 Malignant neoplasm of prostate: Secondary | ICD-10-CM | POA: Diagnosis not present

## 2023-02-28 DIAGNOSIS — H524 Presbyopia: Secondary | ICD-10-CM | POA: Diagnosis not present

## 2023-02-28 DIAGNOSIS — E119 Type 2 diabetes mellitus without complications: Secondary | ICD-10-CM | POA: Diagnosis not present

## 2023-03-02 DIAGNOSIS — Z794 Long term (current) use of insulin: Secondary | ICD-10-CM | POA: Diagnosis not present

## 2023-03-02 DIAGNOSIS — E291 Testicular hypofunction: Secondary | ICD-10-CM | POA: Diagnosis not present

## 2023-03-02 DIAGNOSIS — E1042 Type 1 diabetes mellitus with diabetic polyneuropathy: Secondary | ICD-10-CM | POA: Diagnosis not present

## 2023-03-02 DIAGNOSIS — E1065 Type 1 diabetes mellitus with hyperglycemia: Secondary | ICD-10-CM | POA: Diagnosis not present

## 2023-03-10 DIAGNOSIS — N529 Male erectile dysfunction, unspecified: Secondary | ICD-10-CM | POA: Diagnosis not present

## 2023-03-10 DIAGNOSIS — Z125 Encounter for screening for malignant neoplasm of prostate: Secondary | ICD-10-CM | POA: Diagnosis not present

## 2023-03-18 ENCOUNTER — Telehealth: Payer: Self-pay | Admitting: Family Medicine

## 2023-03-18 NOTE — Telephone Encounter (Signed)
I called pt and confirmed with him that he IS still going to [facility] for his Diabetes care and he states he just had a recent visit with them and told me he would request that they are sure to send all his records to Dr.Metheney's office and pt said to thank you for letting him know that they have not been sending records to [place].

## 2023-03-18 NOTE — Telephone Encounter (Signed)
Pls call pt and see if still goes to [facility] for diabetes care. We haven't had notes from their office since 2020.  If not then we need to schedule hif for diabetes f/u here.

## 2023-03-24 ENCOUNTER — Ambulatory Visit: Payer: Self-pay | Admitting: Skilled Nursing Facility1

## 2023-05-23 DIAGNOSIS — H04123 Dry eye syndrome of bilateral lacrimal glands: Secondary | ICD-10-CM | POA: Insufficient documentation

## 2023-05-23 DIAGNOSIS — H401131 Primary open-angle glaucoma, bilateral, mild stage: Secondary | ICD-10-CM | POA: Diagnosis not present

## 2023-05-23 DIAGNOSIS — E119 Type 2 diabetes mellitus without complications: Secondary | ICD-10-CM | POA: Diagnosis not present

## 2023-05-31 LAB — HM DIABETES EYE EXAM

## 2023-06-09 ENCOUNTER — Encounter: Payer: Self-pay | Admitting: Medical-Surgical

## 2023-06-14 ENCOUNTER — Telehealth (INDEPENDENT_AMBULATORY_CARE_PROVIDER_SITE_OTHER): Payer: Medicare Other | Admitting: Medical-Surgical

## 2023-06-14 ENCOUNTER — Encounter: Payer: Self-pay | Admitting: Medical-Surgical

## 2023-06-14 VITALS — Ht 71.5 in | Wt 207.0 lb

## 2023-06-14 DIAGNOSIS — U071 COVID-19: Secondary | ICD-10-CM | POA: Diagnosis not present

## 2023-06-14 MED ORDER — NIRMATRELVIR/RITONAVIR (PAXLOVID)TABLET: 3.0000 | ORAL_TABLET | Freq: Two times a day (BID) | ORAL | 0 refills | Status: AC

## 2023-06-14 NOTE — Progress Notes (Signed)
Virtual Visit via Video Note  I connected with William Bean on 06/14/23 at  1:00 PM EDT by a video enabled telemedicine application and verified that I am speaking with the correct person using two identifiers.   I discussed the limitations of evaluation and management by telemedicine and the availability of in person appointments. The patient expressed understanding and agreed to proceed.  Patient location: home Provider locations: office  Subjective:    CC: COVID-19 positive  HPI: Very pleasant 66 year old male accompanied by his wife presenting via MyChart video visit to report testing positive for COVID-19 this morning.  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  Yesterday developed a sore throat and today woke up with profound fatigue, weakness, body aches, and poor appetite.  Fever of 100.7 this morning.  Has not tried any over-the-counter medications but is working on rest, hydrating, and eating regular meals.  No red flag symptoms.  Past medical history, Surgical history, Family history not pertinant except as noted below, Social history, Allergies, and medications have been entered into the medical record, reviewed, and corrections made.   Review of Systems: See HPI for pertinent positives and negatives.   Objective:    General: Speaking clearly in complete sentences without any shortness of breath.  Alert and oriented x3.  Normal judgment. No apparent acute distress.  Impression and Recommendations:    1. COVID-19 virus infection Reviewed recommendations for quarantine.  Starting Paxlovid twice daily x 5 days.  Offered prescription options for symptom management but he will use over-the-counter medications as needed.  Continue working on hydration and adequate intake.  I discussed the assessment and treatment plan with the patient. The patient was provided an opportunity to ask questions and all were answered.  The patient agreed with the plan and demonstrated an understanding of the instructions.   The patient was advised to call back or seek an in-person evaluation if the symptoms worsen or if the condition fails to improve as anticipated.  25 minutes of non-face-to-face time was provided during this encounter.  Return in about 3 months (around 09/14/2023) for chronic disease follow up.  Thayer Ohm, DNP, APRN, FNP-BC [facility]

## 2023-07-06 DIAGNOSIS — E1042 Type 1 diabetes mellitus with diabetic polyneuropathy: Secondary | ICD-10-CM | POA: Diagnosis not present

## 2023-07-06 DIAGNOSIS — E663 Overweight: Secondary | ICD-10-CM | POA: Diagnosis not present

## 2023-07-06 DIAGNOSIS — Z6827 Body mass index (BMI) 27.0-27.9, adult: Secondary | ICD-10-CM | POA: Diagnosis not present

## 2023-07-06 DIAGNOSIS — E1065 Type 1 diabetes mellitus with hyperglycemia: Secondary | ICD-10-CM | POA: Diagnosis not present

## 2023-07-06 DIAGNOSIS — E291 Testicular hypofunction: Secondary | ICD-10-CM | POA: Diagnosis not present

## 2023-08-13 ENCOUNTER — Other Ambulatory Visit: Payer: Self-pay | Admitting: Medical-Surgical

## 2023-08-15 ENCOUNTER — Ambulatory Visit: Payer: Medicare Other | Admitting: Medical-Surgical

## 2023-09-06 ENCOUNTER — Ambulatory Visit (INDEPENDENT_AMBULATORY_CARE_PROVIDER_SITE_OTHER): Payer: Medicare Other | Admitting: Medical-Surgical

## 2023-09-06 ENCOUNTER — Ambulatory Visit (INDEPENDENT_AMBULATORY_CARE_PROVIDER_SITE_OTHER): Payer: Medicare Other

## 2023-09-06 ENCOUNTER — Encounter: Payer: Self-pay | Admitting: Medical-Surgical

## 2023-09-06 VITALS — BP 111/55 | HR 75 | Resp 20 | Ht 71.5 in | Wt 212.5 lb

## 2023-09-06 DIAGNOSIS — M4810 Ankylosing hyperostosis [Forestier], site unspecified: Secondary | ICD-10-CM

## 2023-09-06 DIAGNOSIS — M4812 Ankylosing hyperostosis [Forestier], cervical region: Secondary | ICD-10-CM

## 2023-09-06 DIAGNOSIS — Z1211 Encounter for screening for malignant neoplasm of colon: Secondary | ICD-10-CM

## 2023-09-06 DIAGNOSIS — M542 Cervicalgia: Secondary | ICD-10-CM | POA: Diagnosis not present

## 2023-09-06 DIAGNOSIS — L989 Disorder of the skin and subcutaneous tissue, unspecified: Secondary | ICD-10-CM

## 2023-09-06 DIAGNOSIS — M8580 Other specified disorders of bone density and structure, unspecified site: Secondary | ICD-10-CM | POA: Diagnosis not present

## 2023-09-06 NOTE — Progress Notes (Signed)
        Established patient visit  History, exam, impression, and plan:  1. Colon cancer screening Pleasant 67 year old male presenting today with reports of being overdue for colon cancer screening.  Notes that he was supposed to have this done 4-5 months ago but was unable to attend his appointment.  Today he is interested in getting reestablished for an appointment to get this done.  Referring to GI at his previous location to facilitate scheduling for colonoscopy. - Ambulatory referral to Gastroenterology  2. Neck pain Long history of degenerative disc disease and neck pain that has greatly limited his range of motion.  Has very limited extension which in conjunction with his visual deficits leads to frequent episodes of hitting his head on objects that are above his visual field.  This has been going on for several years and he is at a point where he would like further intervention to help improve his neck range of motion.  Last x-ray in 2018 showed severe degenerative disc disease with multilevel foraminal narrowing.  Plan to repeat cervical spine x-rays today to update.  He has been working on home exercises regularly but has been unsuccessful at improving range of motion to date.  Ultimately, would like having an MRI for further evaluation and intervention planning.  May require formal physical therapy for at least 4-6 weeks for insurance approval but we will wait on x-ray report before skipping 2 additional steps. - DG Cervical Spine Complete; Future  3. Skin lesion He has 2 lesions, one on the right cheek and the other on the right upper chest that are concerning.  These have been treated with cryotherapy before but the one on his face has come back.  He would like to have cryotherapy completed today.   Procedures performed this visit: Cryotherapy template Procedure: Cryodestruction of: Benign skin colored lesion of the face, dark brown/black 2 mm flat lesion to the right upper  chest Consent obtained and verified. Time-out conducted. Noted no overlying erythema, induration, or other signs of local infection. Completed without difficulty using Cryo-Gun. Advised to call if fevers/chills, erythema, induration, drainage, or persistent bleeding.  Return in about 3 months (around 12/07/2023) for chronic disease follow up.  __________________________________ Thayer Ohm, DNP, APRN, FNP-BC Primary Care and Sports Medicine [facility] [place]

## 2023-09-15 ENCOUNTER — Ambulatory Visit (INDEPENDENT_AMBULATORY_CARE_PROVIDER_SITE_OTHER): Payer: Medicare Other | Admitting: Medical-Surgical

## 2023-09-15 ENCOUNTER — Encounter: Payer: Self-pay | Admitting: Medical-Surgical

## 2023-09-15 VITALS — BP 115/56 | HR 78 | Resp 20 | Ht 71.5 in | Wt 213.7 lb

## 2023-09-15 DIAGNOSIS — J029 Acute pharyngitis, unspecified: Secondary | ICD-10-CM

## 2023-09-15 LAB — POCT RAPID STREP A (OFFICE): Rapid Strep A Screen: NEGATIVE

## 2023-09-15 LAB — POCT INFLUENZA A/B
Influenza A, POC: NEGATIVE
Influenza B, POC: NEGATIVE

## 2023-09-15 LAB — POC COVID19 BINAXNOW: SARS Coronavirus 2 Ag: NEGATIVE

## 2023-09-15 NOTE — Addendum Note (Signed)
Addended by: Delfino Lovett on: 09/15/2023 05:21 PM   Modules accepted: Orders

## 2023-09-15 NOTE — Progress Notes (Signed)
        Established patient visit  History, exam, impression, and plan:  1. Sore throat Pleasant 67 year old male presenting today with reports of sudden onset sore throat last night that was accompanied by severe burning and pain.  No other associated symptoms.  Since the onset of the sore throat, has been throat clearing with a mild nonproductive cough intermittently.  He is hoping to take care of his daughter who just had a kidney transplant last week and cannot be around her until he is evaluated and determined clear of illness.  This morning, he notes that his sore throat has improved and the pain is gone.  Still having some throat clearing.  Of note, he had chili yesterday eating for dinner and admits to adding copious amounts of a hot sauce that was marked to be extra extra hot.  Wonders if that might have contributed.  Exam normal with the exception of erythema to the posterior oropharyngeal area and mild swelling of the tonsils without exudate.  No recent known exposures to illness.  Up-to-date on vaccinations. POCT strep, Covid, and flu negative in office. Sending a respiratory virus panel PCR for further evaluation.  Suspect that the overuse of hot sauce may have contributed but we will rule out viral etiology. - Respiratory Panel w/ SARS-CoV2  Procedures performed this visit: None.  Return if symptoms worsen or fail to improve.  __________________________________ Thayer Ohm, DNP, APRN, FNP-BC Primary Care and Sports Medicine [facility] [place]

## 2023-09-16 LAB — RESPIRATORY PANEL W/ SARS-COV2

## 2023-09-21 ENCOUNTER — Ambulatory Visit (INDEPENDENT_AMBULATORY_CARE_PROVIDER_SITE_OTHER): Payer: Medicare Other | Admitting: Family Medicine

## 2023-09-21 ENCOUNTER — Encounter: Payer: Self-pay | Admitting: Family Medicine

## 2023-09-21 ENCOUNTER — Ambulatory Visit: Payer: Medicare Other

## 2023-09-21 VITALS — BP 126/86 | HR 66 | Ht 71.5 in | Wt 214.5 lb

## 2023-09-21 DIAGNOSIS — R0989 Other specified symptoms and signs involving the circulatory and respiratory systems: Secondary | ICD-10-CM | POA: Diagnosis not present

## 2023-09-21 DIAGNOSIS — J329 Chronic sinusitis, unspecified: Secondary | ICD-10-CM | POA: Diagnosis not present

## 2023-09-21 DIAGNOSIS — R053 Chronic cough: Secondary | ICD-10-CM | POA: Diagnosis not present

## 2023-09-21 DIAGNOSIS — J40 Bronchitis, not specified as acute or chronic: Secondary | ICD-10-CM

## 2023-09-21 DIAGNOSIS — R062 Wheezing: Secondary | ICD-10-CM

## 2023-09-21 MED ORDER — DOXYCYCLINE HYCLATE 100 MG PO TABS: 100.0000 mg | ORAL_TABLET | Freq: Two times a day (BID) | ORAL | 0 refills | Status: AC

## 2023-09-21 MED ORDER — GUAIFENESIN-CODEINE 100-10 MG/5ML PO SOLN: 5.0000 mL | Freq: Three times a day (TID) | ORAL | 0 refills | Status: DC | PRN

## 2023-09-21 MED ORDER — METHYLPREDNISOLONE 4 MG PO TBPK: ORAL_TABLET | ORAL | 0 refills | Status: DC

## 2023-09-21 NOTE — Assessment & Plan Note (Signed)
Due to symptom duration and audible wheezing on exam do believe this has turned bacterial. Will go ahead and order cxr to rule out pna - will give doxy and medrol dose pack - cough medicine given and pmp reviewed and verified with no red flags

## 2023-09-21 NOTE — Assessment & Plan Note (Signed)
Will go ahead and give medrol dose pack and doxy for concerns of pneumonia vs sinobronchitis vs asthma exacerbation

## 2023-09-21 NOTE — Progress Notes (Signed)
Established patient visit   Patient: William Bean   DOB: 1956-06-23   67 y.o. Male  MRN: [id number] Visit Date: 09/21/2023  Today's healthcare provider: Charlton Amor, DO   Chief Complaint  Patient presents with   Cough    Sore throat, wheezing x6days    SUBJECTIVE    Chief Complaint  Patient presents with   Cough    Sore throat, wheezing x6days   Cough Pertinent negatives include no chest pain, fever or shortness of breath.   HPI     Cough    Additional comments: Sore throat, wheezing x6days      Last edited by Roselyn Reef, CMA on 09/21/2023  1:53 PM.       Pt presents with over a week of sore throat, coughing, congestion. He was seen by PCP last week and tested negative for covid, flu, and strep. Respiratory panel was also ordered but it looks like the tests were cancelled.   Review of Systems  Constitutional:  Negative for activity change, fatigue and fever.  Respiratory:  Positive for cough. Negative for shortness of breath.   Cardiovascular:  Negative for chest pain.  Gastrointestinal:  Negative for abdominal pain.  Genitourinary:  Negative for difficulty urinating.       Current Meds  Medication Sig   albuterol (VENTOLIN HFA) 108 (90 Base) MCG/ACT inhaler Inhale two puffs every 4-6 hours only as needed for shortness of breath or wheezing.   Apple Cider Vinegar 188 MG CAPS    aspirin 81 MG tablet Take 81 mg by mouth daily.   atorvastatin (LIPITOR) 40 MG tablet TAKE 1 TABLET BY MOUTH DAILY   co-enzyme Q-10 30 MG capsule Take 30 mg by mouth daily.   Continuous Blood Gluc Receiver (FREESTYLE LIBRE 2 READER) DEVI APPLY ONE AND CHANGE EVERY 14 DAYS   doxycycline (VIBRA-TABS) 100 MG tablet Take 1 tablet (100 mg total) by mouth 2 (two) times daily for 7 days.   Fluocinolone Acetonide Body 0.01 % OIL Apply 1 Application topically 2 (two) times daily as needed (facial rash).   guaiFENesin-codeine 100-10 MG/5ML syrup Take 5 mLs by mouth 3 (three) times  daily as needed for cough.   lisinopril (ZESTRIL) 5 MG tablet Take 1 tablet (5 mg total) by mouth daily.   methylPREDNISolone (MEDROL DOSEPAK) 4 MG TBPK tablet Follow instructions on pill pack   Multiple Vitamin (MULTIVITAMIN) tablet Take 1 tablet by mouth daily.    Omega-3 Fatty Acids (FISH OIL) 500 MG CAPS Take 2 tablets by mouth daily.    sildenafil (VIAGRA) 100 MG tablet Take 100 mg by mouth as needed.   tadalafil (CIALIS) 20 MG tablet Take by mouth.    OBJECTIVE    BP 126/86 (BP Location: Left Arm, Patient Position: Sitting, Cuff Size: Large)   Pulse 66   Ht 5' 11.5" (1.816 m)   Wt 214 lb 8 oz (97.3 kg)   SpO2 100%   BMI 29.50 kg/m   Physical Exam Vitals and nursing note reviewed.  Constitutional:      General: He is not in acute distress.    Appearance: Normal appearance.  HENT:     Head: Normocephalic and atraumatic.     Right Ear: External ear normal.     Left Ear: External ear normal.     Nose: Nose normal.  Eyes:     Conjunctiva/sclera: Conjunctivae normal.  Cardiovascular:     Rate and Rhythm: Normal rate and regular rhythm.  Pulmonary:  Effort: Pulmonary effort is normal.     Breath sounds: Wheezing present.  Neurological:     General: No focal deficit present.     Mental Status: He is alert and oriented to person, place, and time.  Psychiatric:        Mood and Affect: Mood normal.        Behavior: Behavior normal.        Thought Content: Thought content normal.        Judgment: Judgment normal.        ASSESSMENT & PLAN    Problem List Items Addressed This Visit       Respiratory   Sinobronchitis    Due to symptom duration and audible wheezing on exam do believe this has turned bacterial. Will go ahead and order cxr to rule out pna - will give doxy and medrol dose pack - cough medicine given and pmp reviewed and verified with no red flags      Relevant Medications   doxycycline (VIBRA-TABS) 100 MG tablet   methylPREDNISolone (MEDROL  DOSEPAK) 4 MG TBPK tablet   guaiFENesin-codeine 100-10 MG/5ML syrup     Other   Wheezing - Primary    Will go ahead and give medrol dose pack and doxy for concerns of pneumonia vs sinobronchitis vs asthma exacerbation      Relevant Orders   DG Chest 2 View    No follow-ups on file.      Meds ordered this encounter  Medications   doxycycline (VIBRA-TABS) 100 MG tablet    Sig: Take 1 tablet (100 mg total) by mouth 2 (two) times daily for 7 days.    Dispense:  14 tablet    Refill:  0   methylPREDNISolone (MEDROL DOSEPAK) 4 MG TBPK tablet    Sig: Follow instructions on pill pack    Dispense:  21 tablet    Refill:  0   guaiFENesin-codeine 100-10 MG/5ML syrup    Sig: Take 5 mLs by mouth 3 (three) times daily as needed for cough.    Dispense:  240 mL    Refill:  0    Orders Placed This Encounter  Procedures   DG Chest 2 View    Standing Status:   Future    Number of Occurrences:   1    Standing Expiration Date:   09/20/2024    Order Specific Question:   Preferred imaging location?    Answer:   [facility]    Order Specific Question:   Reason for exam:    Answer:   persistent cough    Order Specific Question:   Release to patient    Answer:   Immediate     Charlton Amor, DO  [facility] Primary [facility] & Sports Medicine at [facility] [phone] (phone) [phone] (fax)  [facility]

## 2023-09-29 DIAGNOSIS — M4810 Ankylosing hyperostosis [Forestier], site unspecified: Secondary | ICD-10-CM | POA: Insufficient documentation

## 2023-10-14 ENCOUNTER — Encounter: Payer: Self-pay | Admitting: Gastroenterology

## 2023-11-13 ENCOUNTER — Ambulatory Visit
Admission: EM | Admit: 2023-11-13 | Discharge: 2023-11-13 | Disposition: A | Discharge status: Home / Self Care | Payer: Medicare Other | Attending: Family Medicine | Admitting: Family Medicine

## 2023-11-13 ENCOUNTER — Other Ambulatory Visit: Payer: Self-pay

## 2023-11-13 DIAGNOSIS — M76821 Posterior tibial tendinitis, right leg: Secondary | ICD-10-CM

## 2023-11-13 DIAGNOSIS — R0781 Pleurodynia: Secondary | ICD-10-CM

## 2023-11-13 DIAGNOSIS — S76201A Unspecified injury of adductor muscle, fascia and tendon of right thigh, initial encounter: Secondary | ICD-10-CM | POA: Diagnosis not present

## 2023-11-13 MED ORDER — METHYLPREDNISOLONE 4 MG PO TBPK: ORAL_TABLET | ORAL | 0 refills | Status: DC

## 2023-11-13 MED ORDER — NAPROXEN SODIUM 550 MG PO TABS: 550.0000 mg | ORAL_TABLET | Freq: Two times a day (BID) | ORAL | 1 refills | Status: DC
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
## 2023-11-13 NOTE — Discharge Instructions (Addendum)
 The treatment for tendinitis includes rest, ice, anti-inflammatory medication Try to wear boot at all times that you are up Put ice for 20 minutes several times a day Take the anti-inflammatories 2 times a day with food Keep your fluid intake good Follow-up with your primary care office in a couple weeks

## 2023-12-02 ENCOUNTER — Ambulatory Visit (AMBULATORY_SURGERY_CENTER): Payer: Medicare Other

## 2023-12-02 VITALS — Ht 71.5 in | Wt 220.0 lb

## 2023-12-02 DIAGNOSIS — Z860100 Personal history of colon polyps, unspecified: Secondary | ICD-10-CM

## 2023-12-02 MED ORDER — SUFLAVE 178.7 G PO SOLR: 1.0000 | ORAL | 0 refills | Status: AC

## 2023-12-02 NOTE — Progress Notes (Signed)

## 2023-12-07 ENCOUNTER — Ambulatory Visit: Payer: Medicare Other | Admitting: Medical-Surgical

## 2023-12-07 DIAGNOSIS — E119 Type 2 diabetes mellitus without complications: Secondary | ICD-10-CM

## 2023-12-27 ENCOUNTER — Encounter: Payer: Self-pay | Admitting: Gastroenterology

## 2023-12-27 ENCOUNTER — Ambulatory Visit: Payer: Medicare Other | Admitting: Medical-Surgical

## 2023-12-29 ENCOUNTER — Ambulatory Visit (AMBULATORY_SURGERY_CENTER): Payer: Medicare Other | Admitting: Gastroenterology

## 2023-12-29 ENCOUNTER — Encounter: Payer: Self-pay | Admitting: Gastroenterology

## 2023-12-29 VITALS — BP 131/62 | HR 86 | Temp 97.2°F | Resp 17 | Ht 71.5 in | Wt 220.0 lb

## 2023-12-29 DIAGNOSIS — Z860101 Personal history of adenomatous and serrated colon polyps: Secondary | ICD-10-CM

## 2023-12-29 DIAGNOSIS — Z860100 Personal history of colon polyps, unspecified: Secondary | ICD-10-CM

## 2023-12-29 DIAGNOSIS — D123 Benign neoplasm of transverse colon: Secondary | ICD-10-CM

## 2023-12-29 DIAGNOSIS — Z1211 Encounter for screening for malignant neoplasm of colon: Secondary | ICD-10-CM | POA: Diagnosis not present

## 2023-12-29 DIAGNOSIS — K648 Other hemorrhoids: Secondary | ICD-10-CM

## 2023-12-29 DIAGNOSIS — D125 Benign neoplasm of sigmoid colon: Secondary | ICD-10-CM

## 2023-12-29 DIAGNOSIS — K640 First degree hemorrhoids: Secondary | ICD-10-CM

## 2023-12-29 MED ORDER — SODIUM CHLORIDE 0.9 % IV SOLN: 500.0000 mL | INTRAVENOUS | Status: DC

## 2023-12-29 NOTE — Op Note (Signed)
 [facility] Patient Name: William Bean Procedure Date: 12/29/2023 9:08 AM MRN: [id number] Endoscopist: Doristine Locks , MD, [id number] Age: 68 Referring MD:  Date of Birth: 1956/09/13 Gender: Male Account #: [id number] Procedure:                Colonoscopy Indications:              Surveillance: Personal history of adenomatous                            polyps on last colonoscopy 5 years ago                           Last colonoscopy was 09/2019 and notable for 3                            small 1-2 mm sigmoid adenomas, small internal                            hemorrhoids, otherwise normal-appearing colon                            (biopsy negative for microscopic colitis) and                            normal TI. Medicines:                Monitored Anesthesia Care Procedure:                Pre-Anesthesia Assessment:                           - Prior to the procedure, a History and Physical                            was performed, and patient medications and                            allergies were reviewed. The patient's tolerance of                            previous anesthesia was also reviewed. The risks                            and benefits of the procedure and the sedation                            options and risks were discussed with the patient.                            All questions were answered, and informed consent                            was obtained. Prior Anticoagulants: The patient has  taken no anticoagulant or antiplatelet agents. ASA                            Grade Assessment: II - A patient with mild systemic                            disease. After reviewing the risks and benefits,                            the patient was deemed in satisfactory condition to                            undergo the procedure.                           After obtaining informed consent, the colonoscope                             was passed under direct vision. Throughout the                            procedure, the patient's blood pressure, pulse, and                            oxygen saturations were monitored continuously. The                            Olympus CF-HQ190L ([id number]) Colonoscope was                            introduced through the anus and advanced to the the                            terminal ileum. The colonoscopy was performed                            without difficulty. The patient tolerated the                            procedure well. The quality of the bowel                            preparation was good. The terminal ileum, ileocecal                            valve, appendiceal orifice, and rectum were                            photographed. Scope In: 10:21:20 AM Scope Out: 10:39:46 AM Scope Withdrawal Time: 0 hours 15 minutes 13 seconds  Total Procedure Duration: 0 hours 18 minutes 26 seconds  Findings:                 The perianal and digital rectal examinations were  normal.                           A 5 mm polyp was found in the transverse colon. The                            polyp was sessile. The polyp was removed with a                            cold snare. Resection and retrieval were complete.                            Estimated blood loss was minimal.                           A 3 mm polyp was found in the sigmoid colon. The                            polyp was sessile. The polyp was removed with a                            cold snare. Resection and retrieval were complete.                            Estimated blood loss was minimal.                           Non-bleeding internal hemorrhoids were found during                            retroflexion. The hemorrhoids were small.                           The terminal ileum appeared normal. Complications:            No immediate complications. Estimated Blood Loss:     Estimated blood loss  was minimal. Impression:               - One 5 mm polyp in the transverse colon, removed                            with a cold snare. Resected and retrieved.                           - One 3 mm polyp in the sigmoid colon, removed with                            a cold snare. Resected and retrieved.                           - Non-bleeding internal hemorrhoids.                           - The examined portion of the ileum was normal. Recommendation:           -  Patient has a contact number available for                            emergencies. The signs and symptoms of potential                            delayed complications were discussed with the                            patient. Return to normal activities tomorrow.                            Written discharge instructions were provided to the                            patient.                           - Resume previous diet.                           - Continue present medications.                           - Await pathology results.                           - Repeat colonoscopy for surveillance based on                            pathology results.                           - Return to GI office PRN. Doristine Locks, MD 12/29/2023 10:47:23 AM

## 2023-12-29 NOTE — Progress Notes (Signed)
 Vss nad trans to pacu

## 2023-12-29 NOTE — Patient Instructions (Signed)

## 2023-12-29 NOTE — Progress Notes (Signed)
 Patient states there have been no changes to medical or surgical history since time of pre-visit.

## 2023-12-29 NOTE — Progress Notes (Signed)
 Called to room to assist during endoscopic procedure.  Patient ID and intended procedure confirmed with present staff. Received instructions for my participation in the procedure from the performing physician.
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
## 2023-12-30 ENCOUNTER — Telehealth: Payer: Self-pay | Admitting: *Deleted

## 2023-12-30 NOTE — Telephone Encounter (Signed)
  Follow up Call-     12/29/2023    9:35 AM  Call back number  Post procedure Call Back phone  # [phone]  Permission to leave phone message Yes     Patient questions:  Do you have a fever, pain , or abdominal swelling? No. Pain Score  0 *  Have you tolerated food without any problems? Yes.    Have you been able to return to your normal activities? Yes.    Do you have any questions about your discharge instructions: Diet   No. Medications  No. Follow up visit  No.  Do you have questions or concerns about your Care? No.  Actions: * If pain score is 4 or above: No action needed, pain <4.

## 2024-01-02 LAB — SURGICAL PATHOLOGY

## 2024-01-03 ENCOUNTER — Encounter: Payer: Self-pay | Admitting: Gastroenterology

## 2024-01-04 ENCOUNTER — Other Ambulatory Visit: Payer: Self-pay | Admitting: Medical-Surgical

## 2024-01-17 ENCOUNTER — Ambulatory Visit

## 2024-01-17 ENCOUNTER — Ambulatory Visit (INDEPENDENT_AMBULATORY_CARE_PROVIDER_SITE_OTHER): Admitting: Medical-Surgical

## 2024-01-17 ENCOUNTER — Encounter: Payer: Self-pay | Admitting: Medical-Surgical

## 2024-01-17 VITALS — BP 123/71 | HR 101 | Resp 20 | Ht 71.5 in | Wt 221.0 lb

## 2024-01-17 DIAGNOSIS — M25552 Pain in left hip: Secondary | ICD-10-CM | POA: Diagnosis not present

## 2024-01-17 DIAGNOSIS — M5412 Radiculopathy, cervical region: Secondary | ICD-10-CM | POA: Diagnosis not present

## 2024-01-17 DIAGNOSIS — M1612 Unilateral primary osteoarthritis, left hip: Secondary | ICD-10-CM | POA: Diagnosis not present

## 2024-01-17 DIAGNOSIS — M79662 Pain in left lower leg: Secondary | ICD-10-CM

## 2024-01-17 DIAGNOSIS — M47812 Spondylosis without myelopathy or radiculopathy, cervical region: Secondary | ICD-10-CM | POA: Diagnosis not present

## 2024-01-17 MED ORDER — TRIAZOLAM 0.25 MG PO TABS: ORAL_TABLET | ORAL | 0 refills | Status: DC

## 2024-01-17 NOTE — Progress Notes (Signed)
 Medical screening examination/treatment was performed by qualified clinical staff member and as supervising provider I was immediately available for consultation/collaboration. I have reviewed documentation and agree with assessment and plan.  Thayer Ohm, DNP, APRN, FNP-BC [facility]
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
## 2024-01-23 DIAGNOSIS — H401131 Primary open-angle glaucoma, bilateral, mild stage: Secondary | ICD-10-CM | POA: Diagnosis not present

## 2024-01-23 DIAGNOSIS — H33031 Retinal detachment with giant retinal tear, right eye: Secondary | ICD-10-CM | POA: Diagnosis not present

## 2024-01-23 DIAGNOSIS — H33001 Unspecified retinal detachment with retinal break, right eye: Secondary | ICD-10-CM | POA: Insufficient documentation

## 2024-01-23 DIAGNOSIS — E119 Type 2 diabetes mellitus without complications: Secondary | ICD-10-CM | POA: Diagnosis not present

## 2024-01-23 DIAGNOSIS — H34231 Retinal artery branch occlusion, right eye: Secondary | ICD-10-CM | POA: Diagnosis not present

## 2024-01-30 ENCOUNTER — Encounter: Payer: Self-pay | Admitting: Medical-Surgical

## 2024-01-31 ENCOUNTER — Other Ambulatory Visit

## 2024-02-02 DIAGNOSIS — E1042 Type 1 diabetes mellitus with diabetic polyneuropathy: Secondary | ICD-10-CM | POA: Diagnosis not present

## 2024-02-02 DIAGNOSIS — E663 Overweight: Secondary | ICD-10-CM | POA: Diagnosis not present

## 2024-02-02 DIAGNOSIS — E1065 Type 1 diabetes mellitus with hyperglycemia: Secondary | ICD-10-CM | POA: Diagnosis not present

## 2024-02-02 DIAGNOSIS — Z6829 Body mass index (BMI) 29.0-29.9, adult: Secondary | ICD-10-CM | POA: Diagnosis not present

## 2024-02-02 DIAGNOSIS — E291 Testicular hypofunction: Secondary | ICD-10-CM | POA: Diagnosis not present

## 2024-02-03 ENCOUNTER — Encounter: Payer: Self-pay | Admitting: Medical-Surgical

## 2024-02-08 DIAGNOSIS — H524 Presbyopia: Secondary | ICD-10-CM | POA: Insufficient documentation

## 2024-02-15 ENCOUNTER — Other Ambulatory Visit: Payer: Self-pay | Admitting: Medical-Surgical

## 2024-02-22 ENCOUNTER — Ambulatory Visit: Payer: Self-pay

## 2024-02-22 NOTE — Telephone Encounter (Signed)
 Copied from CRM [id number]. Topic: Clinical - Red Word Triage >> Feb 22, 2024  9:02 AM William Bean wrote: [facility] that prompted transfer to Nurse Triage: extreme pain in the back   Chief Complaint: Back Pain Symptoms: Pain Frequency: 4-5 Pertinent Negatives: Patient denies weakness, numbness, changes with bowel/bladder control, fever, chest pain, difficulty breathing, burning with urination, blood in urine Disposition: [] ED /[] Urgent Care (no appt availability in office) / [] Appointment(In office/virtual)/ []  [facility]/ [] Home Care/ [x] Refused Recommended Disposition /[] [facility]/ []  Follow-up with PCP Additional Notes: Patient called and advised that he has been having back pain that has gotten much worse since his he last saw his provider in office for this issue 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 He states that this happens very very seldom. Patient states that he is supposed to have an MRI scheduled and he states that he wanted an open MRI.  Patient states that at this time he would consider a closed or partially closed MRI machine if he can have medication to help him stay calm. Patient just wants a message sent to his provider William Butter, NP to see if she would go ahead and help him get set up with this MRI.  He states that if she wants to see him and evaluate him again he would be fine with that but he just wanted to check and see about getting the MRI scheduled with medication. Patient is also advised that if anything worsens, to go to the Emergency Room to be evaluated an he verbalized understanding of this.   Reason for Disposition  [1] Pain radiates into the thigh or further down the leg AND [2] both legs  Answer Assessment - Initial Assessment Questions 1. ONSET: "When did  the pain begin?"      4-5 days ago 2. LOCATION: "Where does it hurt?" (upper, mid or lower back)     Lower back 3. SEVERITY: "How bad is the pain?"  (e.g., Scale 1-10; mild, moderate, or severe)   - MILD (1-3): Doesn't interfere with normal activities.    - MODERATE (4-7): Interferes with normal activities or awakens from sleep.    - SEVERE (8-10): Excruciating pain, unable to do any normal activities.      At the moment patient states he has nullified the pain with the position he is in now. He states that his pain level is always higher than 4 out of 10. 4. PATTERN: "Is the pain constant?" (e.g., yes, no; constant, intermittent)      Intermittent   but much worse when he tries to lay down 5. RADIATION: "Does the pain shoot into your legs or somewhere else?"     To knees 6. CAUSE:  "What do you think is causing the back pain?"      Current back problem and overuse 7. BACK OVERUSE:  "Any recent lifting of heavy objects, strenuous work or exercise?"     Patient drives a lot and states that he overdid weight lifting. 8. MEDICINES: "What have you taken so far for the pain?" (e.g., nothing, acetaminophen, NSAIDS)     No 9. NEUROLOGIC SYMPTOMS: "Do you have any weakness, numbness, or problems with bowel/bladder control?"     No 10. OTHER SYMPTOMS: "Do you have  any other symptoms?" (e.g., fever, abdomen pain, burning with urination, blood in urine)       No  Protocols used: Back Pain-A-AH

## 2024-02-23 NOTE — Telephone Encounter (Signed)
 The MRI is just showing as ordered in the system. It looks like they did the intake questions but it has not been scheduled. Can you check to see what the holdup is? Additionally, I sent in Triazolam for pre-procedural anxiety on 3/11 to be used for his MRI. If we need to change the location of the MRI, that's perfectly fine but would like to get this expedited please.  Thanks, Kenney Peacemaker

## 2024-02-24 ENCOUNTER — Ambulatory Visit: Admission: EM | Admit: 2024-02-24 | Discharge: 2024-02-24 | Disposition: A | Discharge status: Home / Self Care

## 2024-02-24 ENCOUNTER — Encounter: Payer: Self-pay | Admitting: Emergency Medicine

## 2024-02-24 DIAGNOSIS — E119 Type 2 diabetes mellitus without complications: Secondary | ICD-10-CM

## 2024-02-24 MED ORDER — FREESTYLE LIBRE 3 SENSOR MISC: 1.0000 | 0 refills | Status: AC

## 2024-02-24 NOTE — ED Triage Notes (Signed)
 Pt presents needing a new RX for [employer] 3. Pt states he checked his glucose level prior to triage. Reading was 155.

## 2024-02-24 NOTE — Discharge Instructions (Signed)
 I have refilled your sensor.  Follow-up with endocrinology for any further refills.
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
## 2024-02-28 NOTE — Telephone Encounter (Signed)
 Patient canceled because he needed a completely open machine.I have faxed referral to [facility] on [address].

## 2024-02-28 NOTE — Telephone Encounter (Signed)
 Patient informed of location change and will reach out to them for scheduling.

## 2024-03-12 ENCOUNTER — Telehealth: Payer: Self-pay | Admitting: Medical-Surgical

## 2024-03-12 DIAGNOSIS — M47812 Spondylosis without myelopathy or radiculopathy, cervical region: Secondary | ICD-10-CM

## 2024-03-12 DIAGNOSIS — M5412 Radiculopathy, cervical region: Secondary | ICD-10-CM

## 2024-03-12 NOTE — Addendum Note (Signed)
 Addended byCherre Cornish on: 03/12/2024 05:25 PM   Modules accepted: Orders

## 2024-03-12 NOTE — Telephone Encounter (Signed)
 Referral placed for formal physical therapy.  ___________________________________________ Maryl Snook, DNP, APRN, FNP-BC Primary Care and Sports Medicine [facility]

## 2024-03-12 NOTE — Telephone Encounter (Signed)
 Copied from CRM [id number]. Topic: Clinical - Request for Lab/Test Order >> Mar 12, 2024 12:24 PM William Bean wrote: Reason for CRM: William Bean with [facility] imaging called to advise that image request from 01/17/24 MR Cervical Spine Wo Contrast (Order [id number]) was denied by insurance with updated suggestion of six weeks of PT before MRI will be approved. William Bean advised that the MRI will be cancelled for now (originally scheduled for 03/14/24) - but as patient has been waiting for this appointment, please see if there are other options/appeal process to move forward with imaging due to patient's age and what he does for work.

## 2024-03-12 NOTE — Telephone Encounter (Signed)
 Will do, thanks

## 2024-03-12 NOTE — Telephone Encounter (Signed)
 Provider - please advise regarding PT.   Ana, can you please work on getting an appeal for the patient. Thanks in advance.

## 2024-03-13 NOTE — Telephone Encounter (Signed)
 Pt notified through MyChart.

## 2024-04-05 ENCOUNTER — Other Ambulatory Visit: Payer: Self-pay | Admitting: Medical-Surgical

## 2024-04-05 NOTE — Telephone Encounter (Unsigned)
 Copied from CRM [id number]. Topic: Clinical - Medication Refill >> Apr 05, 2024  1:38 PM Brynn Caras wrote: Medication: tadalafil (CIALIS) 20 MG tablet   Has the patient contacted their pharmacy? Yes (Agent: If no, request that the patient contact the pharmacy for the refill. If patient does not wish to contact the pharmacy document the reason why and proceed with request.) (Agent: If yes, when and what did the pharmacy advise?)  This is the patient's preferred pharmacy:   [facility] [id number] - [place], [place] - [address], [place] [address] Phone: [phone]  Fax: [phone]    Is this the correct pharmacy for this prescription? Yes If no, delete pharmacy and type the correct one.   Has the prescription been filled recently? No  Is the patient out of the medication? Yes  Has the patient been seen for an appointment in the last year OR does the patient have an upcoming appointment? Yes  Can we respond through MyChart? No  Agent: Please be advised that Rx refills may take up to 3 business days. We ask that you follow-up with your pharmacy.

## 2024-04-06 MED ORDER — TADALAFIL 20 MG PO TABS: 20.0000 mg | ORAL_TABLET | Freq: Every day | ORAL | 5 refills | Status: AC | PRN

## 2024-04-13 NOTE — Telephone Encounter (Signed)
 Copied from CRM [id number]. Topic: Appointments - Appointment Scheduling >> Apr 12, 2024  4:53 PM Tiffany H wrote: Patient/patient representative is calling to schedule an appointment. Refer to attachments for appointment information.

## 2024-05-08 ENCOUNTER — Ambulatory Visit: Admitting: Medical-Surgical

## 2024-05-15 ENCOUNTER — Ambulatory Visit: Admitting: Medical-Surgical

## 2024-05-22 DIAGNOSIS — Z125 Encounter for screening for malignant neoplasm of prostate: Secondary | ICD-10-CM | POA: Diagnosis not present

## 2024-05-22 DIAGNOSIS — N529 Male erectile dysfunction, unspecified: Secondary | ICD-10-CM | POA: Diagnosis not present

## 2024-06-08 ENCOUNTER — Telehealth: Payer: Self-pay

## 2024-06-08 NOTE — Progress Notes (Signed)
   06/08/2024  Patient ID: William Bean, male   DOB: 1955/11/18, 68 y.o.   MRN: [id number]  This patient is appearing on a report for being at risk of failing the adherence measure for cholesterol (statin) medications this calendar year.   Medication: atorvastatin  40mg  Last fill date: 02/15/2024 for 90 day supply  MyChart message sent to patient.  [name], PharmD, DPLA

## 2024-06-11 ENCOUNTER — Other Ambulatory Visit: Payer: Self-pay

## 2024-06-11 ENCOUNTER — Ambulatory Visit: Admitting: Medical-Surgical

## 2024-06-11 MED ORDER — ATORVASTATIN CALCIUM 40 MG PO TABS: 40.0000 mg | ORAL_TABLET | Freq: Every day | ORAL | 1 refills | Status: DC

## 2024-06-11 MED ORDER — LISINOPRIL 5 MG PO TABS: 5.0000 mg | ORAL_TABLET | Freq: Every day | ORAL | 1 refills | Status: DC

## 2024-06-11 NOTE — Progress Notes (Signed)
   06/11/2024  Patient ID: William Bean, male   DOB: 12-Nov-1955, 69 y.o.   MRN: [id number]  Christinia message from patient stating he does need a refill for atorvastatin  40mg  as well as lisinopril  5mg  sent to [facility] in [place], [place], as that is where he currently is.  Patient was last seen by PCP 3/11 and has a follow-up scheduled for 9/4.  Last lipid panel was approximately 3 years ago and reflected good control with LDL of 52 and TG of 55.  Last OV BP reading was elevated at 154/83.  Prescriptions pending for a 3 month supply of both atorvastatin  40mg  and lisinopril  5mg .  I do recommend a follow-up lipid panel at PCP visit next month.  Could also consider increasing lisinopril  to 10mg  daily (patient can take 2 tablets of 5mg ) if BP continues to be >130/80.  [name], PharmD, DPLA

## 2024-06-22 ENCOUNTER — Ambulatory Visit (INDEPENDENT_AMBULATORY_CARE_PROVIDER_SITE_OTHER): Admitting: Medical-Surgical

## 2024-06-22 ENCOUNTER — Encounter: Payer: Self-pay | Admitting: Medical-Surgical

## 2024-06-22 VITALS — BP 123/71 | HR 71 | Resp 20 | Ht 71.5 in | Wt 215.1 lb

## 2024-06-22 DIAGNOSIS — R42 Dizziness and giddiness: Secondary | ICD-10-CM | POA: Diagnosis not present

## 2024-06-22 DIAGNOSIS — I10 Essential (primary) hypertension: Secondary | ICD-10-CM

## 2024-06-22 LAB — POCT URINALYSIS DIP (CLINITEK)
Bilirubin, UA: NEGATIVE
Blood, UA: NEGATIVE
Glucose, UA: NEGATIVE mg/dL
Ketones, POC UA: NEGATIVE mg/dL
Leukocytes, UA: NEGATIVE
Nitrite, UA: NEGATIVE
POC PROTEIN,UA: NEGATIVE
Spec Grav, UA: 1.01 (ref 1.010–1.025)
Urobilinogen, UA: 1 U/dL
pH, UA: 6 (ref 5.0–8.0)
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
## 2024-06-23 LAB — CBC WITH DIFFERENTIAL/PLATELET
Basophils Absolute: 0 x10E3/uL (ref 0.0–0.2)
Basos: 1 %
EOS (ABSOLUTE): 0.2 x10E3/uL (ref 0.0–0.4)
Eos: 3 %
Hematocrit: 43.4 % (ref 37.5–51.0)
Hemoglobin: 14.2 g/dL (ref 13.0–17.7)
Immature Grans (Abs): 0 x10E3/uL (ref 0.0–0.1)
Immature Granulocytes: 0 %
Lymphocytes Absolute: 1.8 x10E3/uL (ref 0.7–3.1)
Lymphs: 31 %
MCH: 29.8 pg (ref 26.6–33.0)
MCHC: 32.7 g/dL (ref 31.5–35.7)
MCV: 91 fL (ref 79–97)
Monocytes Absolute: 0.4 x10E3/uL (ref 0.1–0.9)
Monocytes: 7 %
Neutrophils Absolute: 3.4 x10E3/uL (ref 1.4–7.0)
Neutrophils: 58 %
Platelets: 146 x10E3/uL — ABNORMAL LOW (ref 150–450)
RBC: 4.77 x10E6/uL (ref 4.14–5.80)
RDW: 13.4 % (ref 11.6–15.4)
WBC: 5.8 x10E3/uL (ref 3.4–10.8)

## 2024-06-23 LAB — CMP14+EGFR
ALT: 16 IU/L (ref 0–44)
AST: 20 IU/L (ref 0–40)
Albumin: 4.5 g/dL (ref 3.9–4.9)
Alkaline Phosphatase: 117 IU/L (ref 44–121)
BUN/Creatinine Ratio: 19 (ref 10–24)
BUN: 19 mg/dL (ref 8–27)
Bilirubin Total: 0.7 mg/dL (ref 0.0–1.2)
CO2: 20 mmol/L (ref 20–29)
Calcium: 9.5 mg/dL (ref 8.6–10.2)
Chloride: 102 mmol/L (ref 96–106)
Creatinine, Ser: 1.02 mg/dL (ref 0.76–1.27)
Globulin, Total: 2.3 g/dL (ref 1.5–4.5)
Glucose: 154 mg/dL — ABNORMAL HIGH (ref 70–99)
Potassium: 4.6 mmol/L (ref 3.5–5.2)
Sodium: 138 mmol/L (ref 134–144)
Total Protein: 6.8 g/dL (ref 6.0–8.5)
eGFR: 79 mL/min/1.73 (ref >59)

## 2024-06-23 LAB — IRON,TIBC AND FERRITIN PANEL
Ferritin: 75 ng/mL (ref 30–400)
Iron Saturation: 28 % (ref 15–55)
Iron: 67 ug/dL (ref 38–169)
Total Iron Binding Capacity: 239 ug/dL — ABNORMAL LOW (ref 250–450)
UIBC: 172 ug/dL (ref 111–343)

## 2024-06-23 LAB — MAGNESIUM: Magnesium: 2 mg/dL (ref 1.6–2.3)

## 2024-06-25 ENCOUNTER — Ambulatory Visit: Payer: Self-pay | Admitting: Medical-Surgical

## 2024-06-29 DIAGNOSIS — E1165 Type 2 diabetes mellitus with hyperglycemia: Secondary | ICD-10-CM | POA: Diagnosis not present

## 2024-06-29 DIAGNOSIS — Z794 Long term (current) use of insulin: Secondary | ICD-10-CM | POA: Diagnosis not present

## 2024-06-29 NOTE — Addendum Note (Signed)
 Addended by: BONNY JON DEL on: 06/29/2024 08:55 AM   Modules accepted: Orders

## 2024-07-06 ENCOUNTER — Ambulatory Visit: Admitting: Medical-Surgical

## 2024-07-10 ENCOUNTER — Encounter: Payer: Self-pay | Admitting: Medical-Surgical

## 2024-07-10 ENCOUNTER — Ambulatory Visit (INDEPENDENT_AMBULATORY_CARE_PROVIDER_SITE_OTHER): Admitting: Medical-Surgical

## 2024-07-10 ENCOUNTER — Ambulatory Visit

## 2024-07-10 VITALS — BP 132/63 | HR 74 | Resp 20 | Ht 71.5 in | Wt 215.0 lb

## 2024-07-10 DIAGNOSIS — M79621 Pain in right upper arm: Secondary | ICD-10-CM

## 2024-07-10 DIAGNOSIS — E109 Type 1 diabetes mellitus without complications: Secondary | ICD-10-CM

## 2024-07-10 DIAGNOSIS — M4810 Ankylosing hyperostosis [Forestier], site unspecified: Secondary | ICD-10-CM

## 2024-07-10 DIAGNOSIS — M79601 Pain in right arm: Secondary | ICD-10-CM | POA: Diagnosis not present

## 2024-07-10 DIAGNOSIS — M19011 Primary osteoarthritis, right shoulder: Secondary | ICD-10-CM | POA: Diagnosis not present

## 2024-07-10 NOTE — Progress Notes (Signed)
 Established patient visit   History of Present Illness   Discussed the use of AI scribe software for clinical note transcription with the patient, who gave verbal consent to proceed.  History of Present Illness   William Bean is a 68 year old male with diabetes who presents with persistent upper arm pain and diabetes management.  Upper arm paresthesia and weakness - Persistent upper arm pain for months, possibly up to a year - Described as a numbing sensation, making the arm feel 'dead' - Pain localized to the upper arm without radiation to the shoulder or elbow - Pain is not sharp or stabbing - Significantly impairs ability to use the arm effectively - No recent falls or injuries to the arm  Diabetes mellitus management - Diabetes diagnosed in 1993 - On insulin  therapy for many years - Difficulty regulating blood glucose, with fluctuations from the 40s to the 200s - Recent switch to Ozempic  due to high cost of Mounjaro - Concern about potential weight loss with Ozempic , as weight has already decreased and is currently stable at approximately 210 pounds  Ophthalmologic history and medication concerns - History of eye stroke and retinal repair surgery - Concern about potential side effects of Ozempic , particularly in relation to prior eye issues       Physical Exam   Physical Exam Vitals and nursing note reviewed.  Constitutional:      General: He is not in acute distress.    Appearance: Normal appearance.  HENT:     Head: Normocephalic and atraumatic.  Cardiovascular:     Rate and Rhythm: Normal rate and regular rhythm.     Pulses: Normal pulses.     Heart sounds: Normal heart sounds. No murmur heard.    No friction rub. No gallop.  Pulmonary:     Effort: Pulmonary effort is normal. No respiratory distress.     Breath sounds: Normal breath sounds.  Musculoskeletal:       Arms:     Comments: Indicated area is where pain is described. Certain  movements bring weakness and a dead feeling per patient description.  Skin:    General: Skin is warm and dry.  Neurological:     Mental Status: He is alert and oriented to person, place, and time.  Psychiatric:        Mood and Affect: Mood normal.        Behavior: Behavior normal.        Thought Content: Thought content normal.        Judgment: Judgment normal.    Assessment & Plan   Assessment and Plan    Type 1 diabetes mellitus, insulin  dependent Long-standing insulin -dependent type 2 diabetes with significant glucose fluctuations. Managed by endocrinology. - Explained potential side effects of Ozempic , including gastrointestinal symptoms and potential weight loss. - Emphasized cardioprotective and kidney protective benefits of Ozempic . - Discussed potential to reduce or eliminate insulin  use if Ozempic  is effective. - Start Ozempic  0.25 mg weekly for 4 weeks, then increase to 0.5 mg weekly if tolerated as instructed by endocrinology. - Monitor blood glucose levels and adjust insulin  as instructed. - Educated on subcutaneous injection technique, avoiding 2-inch area around the umbilicus.  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  Chronic right upper arm pain Chronic right upper arm pain with numbing sensation and occasional radial pain. Differential  includes soft tissue injury or cervical spine-related pain. - Order MRI of the right upper arm to assess soft tissue structures. - Order x-ray of the right upper arm.  General Health Maintenance Due for flu, COVID, and RSV vaccinations. - Discussed option of simultaneous administration or spacing them out by two weeks.      Follow up   Return in about 6 months (around 01/07/2025) for chronic disease follow up. __________________________________ Zada FREDRIK Palin, DNP, APRN,  FNP-BC Primary Care and Sports Medicine [facility] [place]

## 2024-07-10 NOTE — Patient Instructions (Signed)
 Right upper arm x-ray today! MRI has been ordered for further evaluation.

## 2024-07-12 ENCOUNTER — Ambulatory Visit: Admitting: Medical-Surgical

## 2024-07-14 ENCOUNTER — Telehealth: Admitting: Family

## 2024-07-14 DIAGNOSIS — F40240 Claustrophobia: Secondary | ICD-10-CM

## 2024-07-14 DIAGNOSIS — M79621 Pain in right upper arm: Secondary | ICD-10-CM | POA: Diagnosis not present

## 2024-07-15 MED ORDER — TRIAZOLAM 0.25 MG PO TABS: ORAL_TABLET | ORAL | 0 refills | Status: DC

## 2024-07-15 NOTE — Addendum Note (Signed)
 Addended by: LAVELL LYE A on: 07/15/2024 03:12 PM   Modules accepted: Orders

## 2024-07-15 NOTE — Progress Notes (Signed)
 Hello, We usually do not order any controlled medications through virtual visits. However, since you are scheduled for your MRI tomorrow morning I will prescribe you two tablets of triazolam  0.25 mg. Take 1-2 hours prior to your scheduled MRI. Do not drive with this medication.   Bari Learn, FNP  Pt reviewed in [place] control database.

## 2024-07-16 ENCOUNTER — Ambulatory Visit
Admission: RE | Admit: 2024-07-16 | Discharge: 2024-07-16 | Disposition: A | Discharge status: Home / Self Care | Source: Ambulatory Visit | Attending: Medical-Surgical | Admitting: Medical-Surgical

## 2024-07-16 DIAGNOSIS — M79621 Pain in right upper arm: Secondary | ICD-10-CM

## 2024-07-16 DIAGNOSIS — M19011 Primary osteoarthritis, right shoulder: Secondary | ICD-10-CM | POA: Diagnosis not present

## 2024-07-19 ENCOUNTER — Ambulatory Visit: Payer: Self-pay | Admitting: Medical-Surgical

## 2024-07-30 ENCOUNTER — Encounter: Payer: Self-pay | Admitting: Medical-Surgical

## 2024-08-30 DIAGNOSIS — K080 Exfoliation of teeth due to systemic causes: Secondary | ICD-10-CM | POA: Diagnosis not present

## 2024-09-12 DIAGNOSIS — H34231 Retinal artery branch occlusion, right eye: Secondary | ICD-10-CM | POA: Diagnosis not present

## 2024-09-12 DIAGNOSIS — E119 Type 2 diabetes mellitus without complications: Secondary | ICD-10-CM | POA: Diagnosis not present

## 2024-09-12 DIAGNOSIS — H401131 Primary open-angle glaucoma, bilateral, mild stage: Secondary | ICD-10-CM | POA: Diagnosis not present

## 2024-09-12 DIAGNOSIS — H33031 Retinal detachment with giant retinal tear, right eye: Secondary | ICD-10-CM | POA: Diagnosis not present

## 2024-09-28 DIAGNOSIS — Z6828 Body mass index (BMI) 28.0-28.9, adult: Secondary | ICD-10-CM | POA: Diagnosis not present

## 2024-09-28 DIAGNOSIS — E663 Overweight: Secondary | ICD-10-CM | POA: Diagnosis not present

## 2024-09-28 DIAGNOSIS — E291 Testicular hypofunction: Secondary | ICD-10-CM | POA: Diagnosis not present

## 2024-09-28 DIAGNOSIS — E1065 Type 1 diabetes mellitus with hyperglycemia: Secondary | ICD-10-CM | POA: Diagnosis not present

## 2024-09-28 DIAGNOSIS — E1042 Type 1 diabetes mellitus with diabetic polyneuropathy: Secondary | ICD-10-CM | POA: Diagnosis not present

## 2024-10-26 DIAGNOSIS — Z794 Long term (current) use of insulin: Secondary | ICD-10-CM | POA: Diagnosis not present

## 2024-10-26 DIAGNOSIS — E1165 Type 2 diabetes mellitus with hyperglycemia: Secondary | ICD-10-CM | POA: Diagnosis not present

## 2024-11-22 ENCOUNTER — Encounter: Payer: Self-pay | Admitting: Medical-Surgical

## 2024-11-22 ENCOUNTER — Ambulatory Visit: Admitting: Medical-Surgical

## 2024-11-22 VITALS — BP 128/73 | HR 77 | Ht 71.0 in | Wt 200.0 lb

## 2024-11-22 DIAGNOSIS — E119 Type 2 diabetes mellitus without complications: Secondary | ICD-10-CM

## 2024-11-22 DIAGNOSIS — E871 Hypo-osmolality and hyponatremia: Secondary | ICD-10-CM | POA: Diagnosis not present

## 2024-11-22 DIAGNOSIS — M79621 Pain in right upper arm: Secondary | ICD-10-CM | POA: Diagnosis not present

## 2024-11-22 DIAGNOSIS — L989 Disorder of the skin and subcutaneous tissue, unspecified: Secondary | ICD-10-CM | POA: Diagnosis not present

## 2024-11-22 DIAGNOSIS — D649 Anemia, unspecified: Secondary | ICD-10-CM | POA: Diagnosis not present

## 2024-11-22 DIAGNOSIS — F419 Anxiety disorder, unspecified: Secondary | ICD-10-CM

## 2024-11-22 DIAGNOSIS — F40240 Claustrophobia: Secondary | ICD-10-CM | POA: Diagnosis not present

## 2024-11-22 MED ORDER — LISINOPRIL 5 MG PO TABS: 5.0000 mg | ORAL_TABLET | Freq: Every day | ORAL | 1 refills | Status: DC

## 2024-11-22 MED ORDER — ATORVASTATIN CALCIUM 40 MG PO TABS: 40.0000 mg | ORAL_TABLET | Freq: Every day | ORAL | 1 refills | Status: AC

## 2024-11-22 MED ORDER — TRIAZOLAM 0.25 MG PO TABS: ORAL_TABLET | ORAL | 0 refills | Status: AC

## 2024-11-22 NOTE — Progress Notes (Unsigned)
 "       Established patient visit   History of Present Illness   Discussed the use of AI scribe software for clinical note transcription with the patient, who gave verbal consent to proceed.  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  Appetite suppression and hydration difficulties on mounjaro - Marked appetite suppression since starting Mounjaro - Difficulty meeting protein and fluid intake goals - Concerned about preserving muscle mass - Current weight 200 lb; ideal body weight 166 lb - Previously drank large volumes due to diabetes, now struggles to maintain adequate hydration  Flight anxiety - Planning a flight to the [facility] to visit his son - Significant flight anxiety after a prior panic episode on a trip to [place] - Triazolam  was effective for MRI-related anxiety in the past and is being considered for the upcoming flight - Increased anxiety due to recent death of daughter-in-laws mother from blood clots during a long flight  Right arm pain and weakness - Right arm pain limits weight-bearing exercise - Perceived weakness and muscle atrophy in the right arm - Seeking guidance on safe strengthening exercises  Facial skin lesions - Spots on the face appearing that were previously treated with cryotherapy  Recent fatigue and confusion - Recent episode of severe fatigue and confusion leading to urgent care visit - Labs reportedly showed mild anemia and slightly low sodium - Stopped taking vitamins approximately two months ago - No major recent changes in diabetes regimen     Physical Exam   Physical Exam Vitals reviewed.  Constitutional:      General: He is not in acute distress.    Appearance: Normal appearance. He is not ill-appearing.  HENT:     Head: Normocephalic and atraumatic.  Cardiovascular:     Rate and Rhythm: Normal rate and regular  rhythm.     Pulses: Normal pulses.     Heart sounds: Normal heart sounds. No murmur heard.    No friction rub. No gallop.  Pulmonary:     Effort: Pulmonary effort is normal. No respiratory distress.     Breath sounds: Normal breath sounds.  Skin:    General: Skin is warm and dry.     Findings: Lesion (1-28mm flesh-toned lesion on the right cheek) present.  Neurological:     Mental Status: He is alert and oriented to person, place, and time.  Psychiatric:        Mood and Affect: Mood normal.        Behavior: Behavior normal.        Thought Content: Thought content normal.        Judgment: Judgment normal.    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 Prefers a dose of 1.5 tablets of triazolam  to manage symptoms without excessive sedation. - Prescribed triazolam  with instructions to take 1.5 tablets as needed for anxiety during flights. - Advised wearing compression socks and moving frequently during flights to prevent blood clots.  Right upper arm pain Chronic pain likely related to spinal issues and muscle atrophy. Limits exercise capacity, particularly in the arms. Prefers to start with physical therapy. - Referred to physical therapy for right upper arm pain management. - If no improvement after six weeks of physical therapy, will consider referral to orthopedics for further evaluation and possible  interventions.  Facial skin lesions Presence of facial skin lesions, some of which have bled and are bothersome. Previous treatment with cryotherapy. - Performed cryotherapy on facial skin lesions.  Type 2 diabetes mellitus Currently managed with Mounjaro, effective for weight loss but causing decreased appetite. Concerned about further weight loss and muscle loss. - Discuss with endocrinologist about maintaining current dose of Mounjaro at 7.5 mg. - Reviewed  recommendations for protein and fluid intake to prevent dehydration and loss of lean muscle mass.   Mild normocytic anemia Mild anemia with hemoglobin at 13.5 g/dL. Symptoms include leg cramps, confusion, and restlessness. Likely related to discontinuation of multivitamins. - Resume multivitamins with B12 and iron.  Mild hyponatremia Sodium level at 135 mmol/L. Symptoms include fatigue and brain fog. - Liberalize sodium intake in diet over the next few days. - Consider substituting one bottle of electrolyte-rich fluids like Gatorade Zero or Powerade Zero.    Follow up   Return for follow up visit as scheduled. __________________________________ William FREDRIK Palin, DNP, APRN, FNP-BC Primary Care and Sports Medicine [facility] William Bean "

## 2024-11-23 ENCOUNTER — Encounter: Payer: Self-pay | Admitting: Medical-Surgical

## 2024-12-07 ENCOUNTER — Other Ambulatory Visit: Payer: Self-pay | Admitting: Medical-Surgical

## 2024-12-11 ENCOUNTER — Encounter: Payer: Self-pay | Admitting: Medical-Surgical

## 2024-12-11 DIAGNOSIS — J45909 Unspecified asthma, uncomplicated: Secondary | ICD-10-CM

## 2024-12-11 MED ORDER — ALBUTEROL SULFATE HFA 108 (90 BASE) MCG/ACT IN AERS: INHALATION_SPRAY | RESPIRATORY_TRACT | 0 refills | Status: AC

## 2024-12-12 NOTE — Telephone Encounter (Signed)
 Patient informed and scheduled for 12/20/2024 At 2pm = offered early appt but due to impending weather concerns and patient coming from [place] was scheduled for this date. He will call and reschedule if he feels this appt needs to be moved up.

## 2024-12-14 NOTE — Progress Notes (Signed)
 Rmani Kellogg                                          MRN: [id number]   12/14/2024   The [facility] Quality Team Specialist reviewed this patient medical record for the purposes of chart review for care gap closure. The following were reviewed: abstraction for care gap closure-glycemic status assessment.    [facility] Quality Team

## 2024-12-20 ENCOUNTER — Ambulatory Visit: Admitting: Medical-Surgical

## 2025-01-08 ENCOUNTER — Ambulatory Visit: Admitting: Medical-Surgical
